# Patient Record
Sex: Male | Born: 1955 | Hispanic: No | Smoking: Current every day smoker
Health system: Southern US, Community
[De-identification: ages and names within clinical notes are randomized; demographics above are authoritative.]

## PROBLEM LIST (undated history)

## (undated) DIAGNOSIS — I2510 Atherosclerotic heart disease of native coronary artery without angina pectoris: Secondary | ICD-10-CM

## (undated) DIAGNOSIS — I10 Essential (primary) hypertension: Secondary | ICD-10-CM

## (undated) HISTORY — PX: OTHER SURGICAL HISTORY: SHX169

## (undated) HISTORY — DX: Atherosclerotic heart disease of native coronary artery without angina pectoris: I25.10

## (undated) HISTORY — PX: CORONARY ARTERY BYPASS GRAFT: SHX141

---

## 2003-07-29 ENCOUNTER — Emergency Department (HOSPITAL_COMMUNITY): Admission: EM | Admit: 2003-07-29 | Discharge: 2003-07-30 | Payer: Self-pay | Admitting: Emergency Medicine

## 2006-04-29 ENCOUNTER — Emergency Department (HOSPITAL_COMMUNITY): Admission: EM | Admit: 2006-04-29 | Discharge: 2006-04-29 | Payer: Self-pay | Admitting: Emergency Medicine

## 2007-01-19 ENCOUNTER — Emergency Department (HOSPITAL_COMMUNITY): Admission: EM | Admit: 2007-01-19 | Discharge: 2007-01-19 | Payer: Self-pay | Admitting: Emergency Medicine

## 2007-11-22 IMAGING — CR DG ELBOW COMPLETE 3+V*R*
2 series · 2 of 2 positions shown · non-contrast
Comparison: none

CLINICAL DATA: Elbow injury, pain

RIGHT ELBOW - 4 VIEW

[view not recorded (1 of 2)]
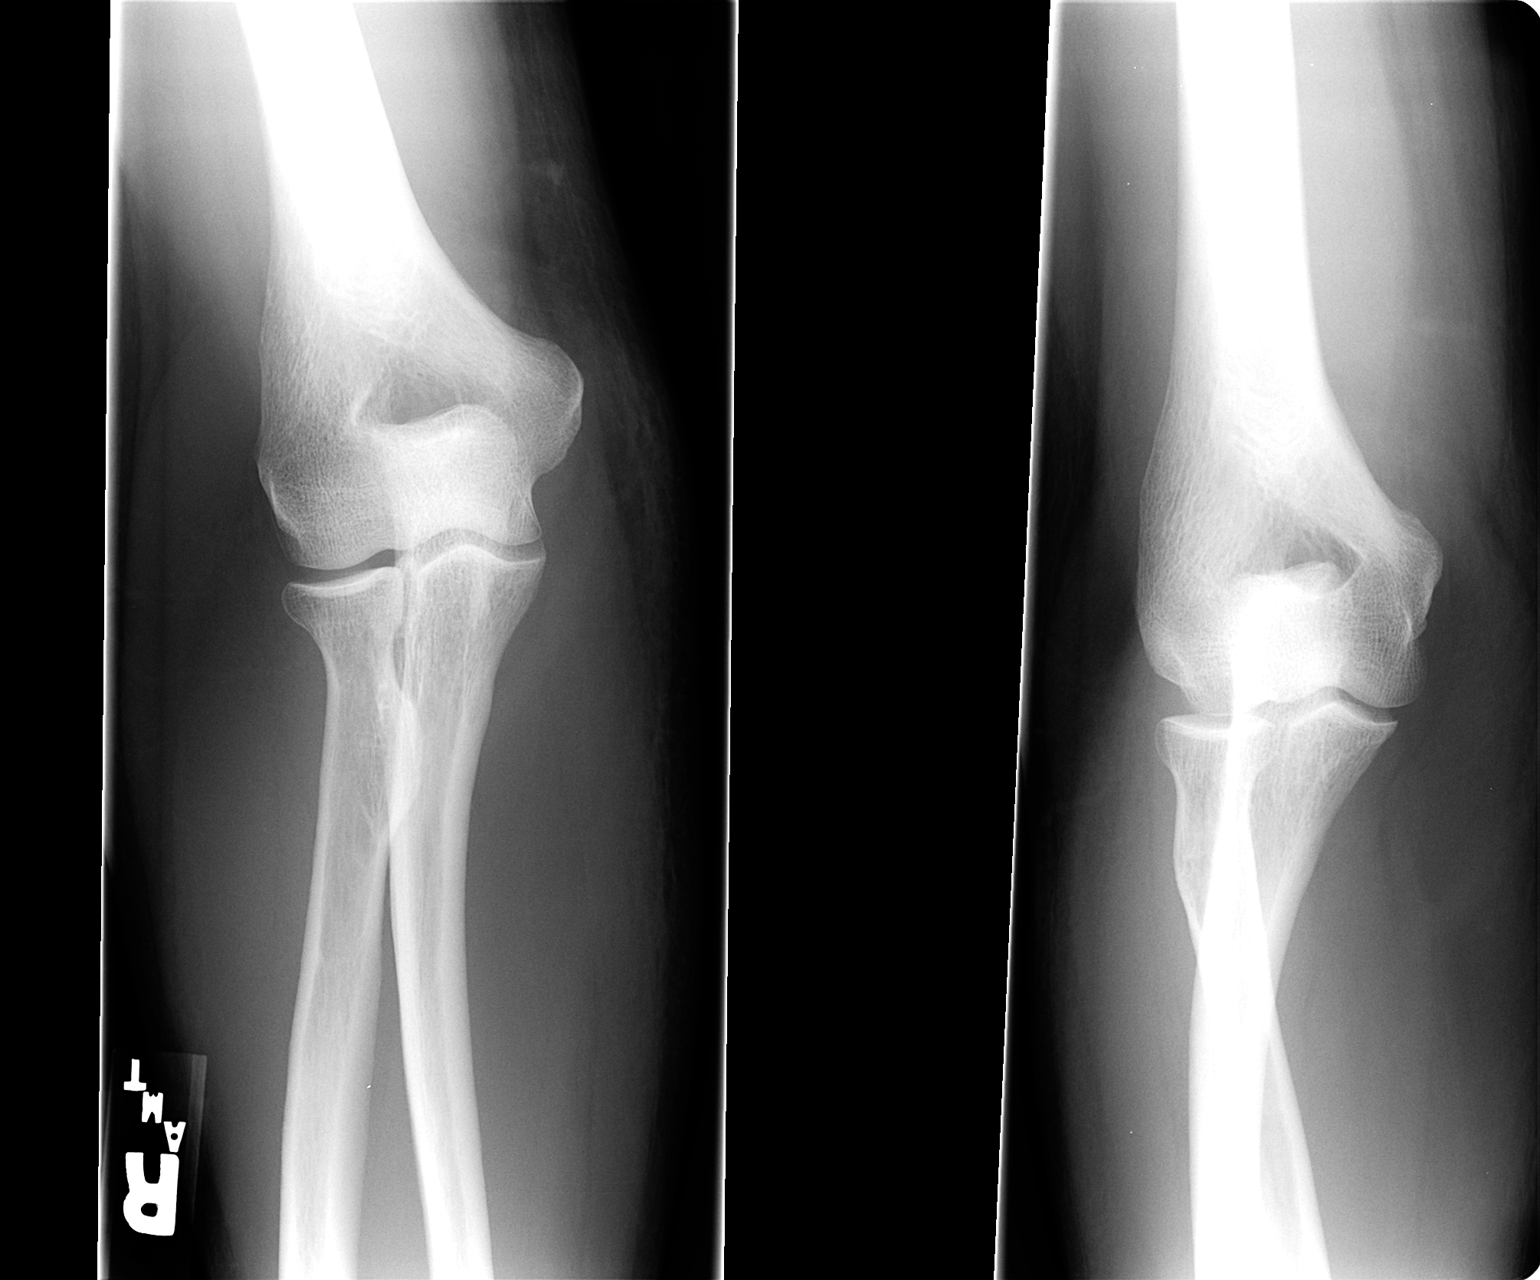

[view not recorded (2 of 2)]
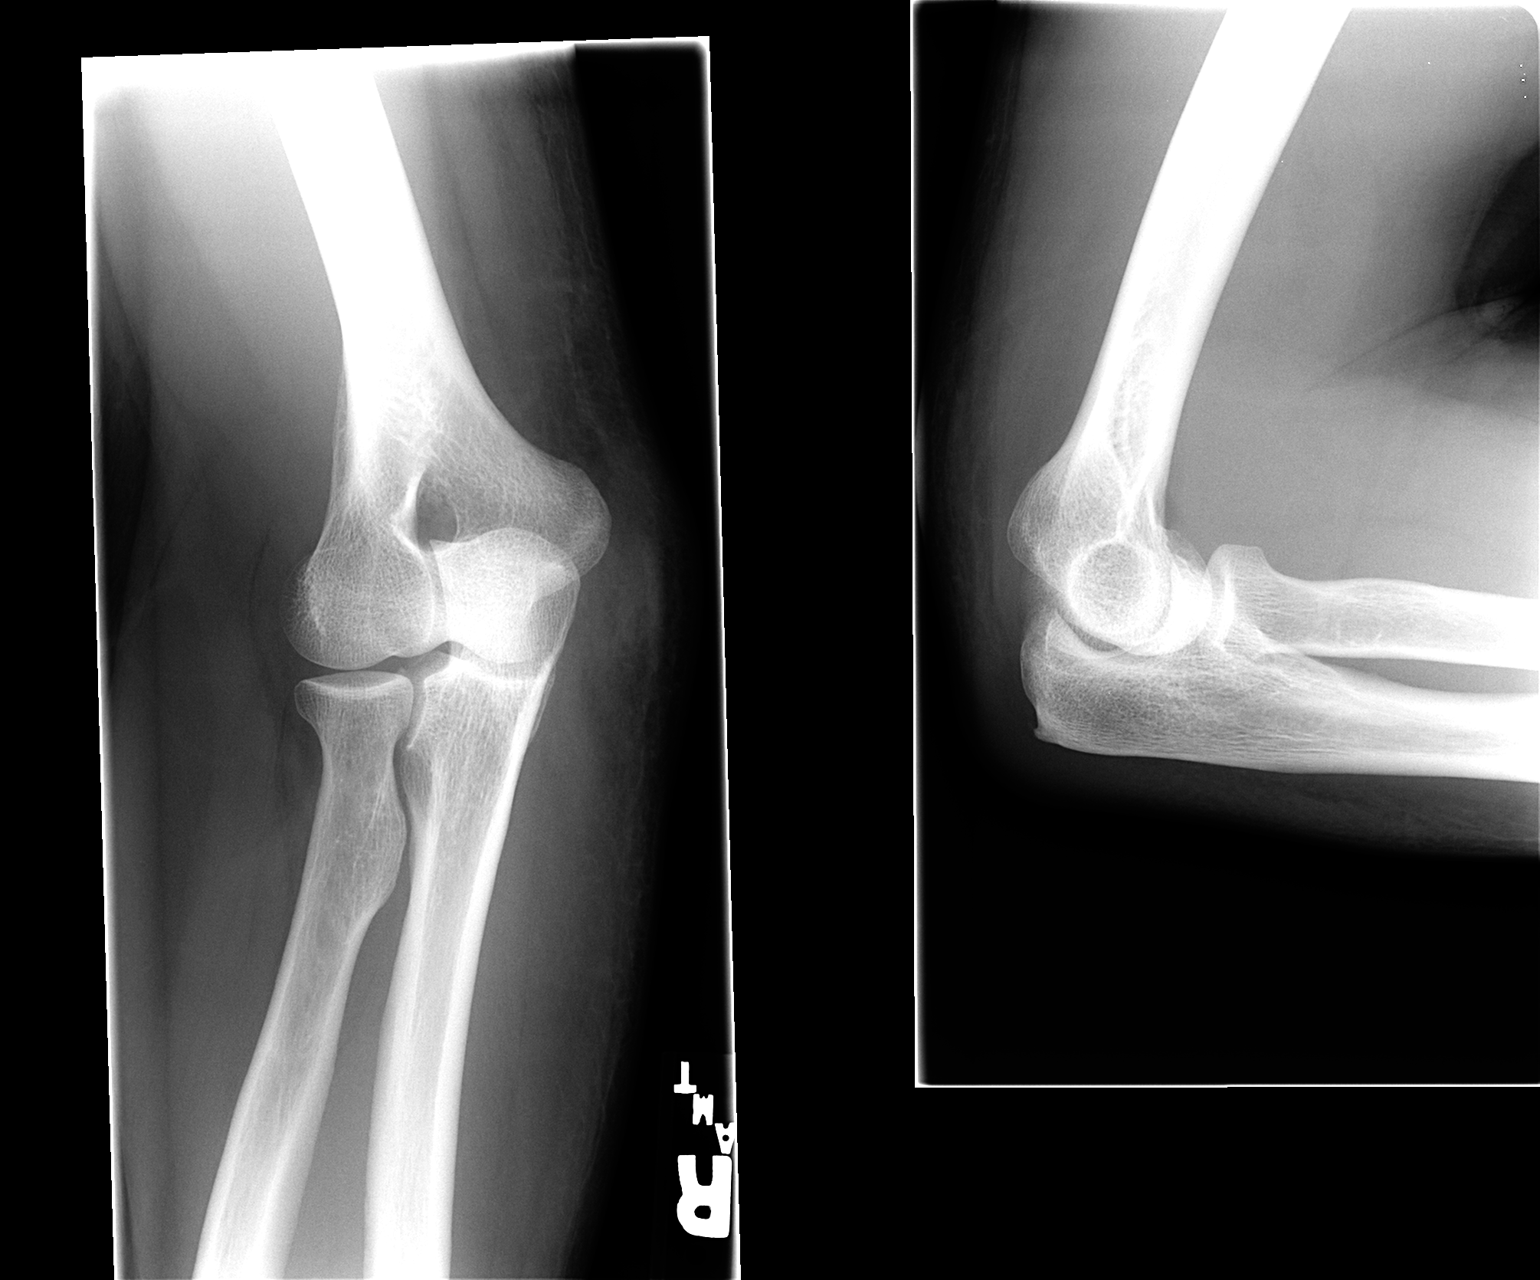

[2 of 2 positions shown; findings below may reference images not displayed]

FINDINGS: There is soft tissue swelling noted posterior and medially within the
right elbow. No joint effusion, fracture, subluxation, or dislocation.

IMPRESSION

No acute bony abnormality.

## 2014-07-06 DIAGNOSIS — I213 ST elevation (STEMI) myocardial infarction of unspecified site: Secondary | ICD-10-CM | POA: Insufficient documentation

## 2019-12-13 DIAGNOSIS — J410 Simple chronic bronchitis: Secondary | ICD-10-CM | POA: Insufficient documentation

## 2019-12-13 DIAGNOSIS — G8929 Other chronic pain: Secondary | ICD-10-CM | POA: Insufficient documentation

## 2019-12-13 DIAGNOSIS — M545 Low back pain, unspecified: Secondary | ICD-10-CM | POA: Insufficient documentation

## 2021-02-18 DIAGNOSIS — R079 Chest pain, unspecified: Secondary | ICD-10-CM | POA: Insufficient documentation

## 2021-06-12 ENCOUNTER — Other Ambulatory Visit: Payer: Self-pay | Admitting: Family Medicine

## 2021-06-12 DIAGNOSIS — I10 Essential (primary) hypertension: Secondary | ICD-10-CM

## 2021-06-20 ENCOUNTER — Ambulatory Visit
Admission: RE | Admit: 2021-06-20 | Discharge: 2021-06-20 | Disposition: A | Discharge status: Home / Self Care | Payer: Disability Insurance | Source: Ambulatory Visit | Attending: Family Medicine | Admitting: Family Medicine

## 2021-06-20 DIAGNOSIS — I340 Nonrheumatic mitral (valve) insufficiency: Secondary | ICD-10-CM | POA: Diagnosis not present

## 2021-06-20 DIAGNOSIS — I10 Essential (primary) hypertension: Secondary | ICD-10-CM

## 2021-06-20 LAB — ECHOCARDIOGRAM COMPLETE
AR max vel: 2.55 cm2
AV Area VTI: 2.76 cm2
AV Area mean vel: 2.25 cm2
AV Mean grad: 1 mmHg
AV Peak grad: 2.2 mmHg
Ao pk vel: 0.75 m/s
Area-P 1/2: 3.83 cm2
Calc EF: 19.8 %
MV VTI: 1.79 cm2
S' Lateral: 5.7 cm
Single Plane A2C EF: 19.7 %
Single Plane A4C EF: 19.7 %

## 2021-06-20 NOTE — Progress Notes (Signed)
*  PRELIMINARY RESULTS* Echocardiogram 2D Echocardiogram has been performed.  Aaron Hines 06/20/2021, 10:58 AM

## 2021-11-04 NOTE — Progress Notes (Deleted)
?Cardiology Office Note:   ? ?Date:  11/04/2021  ? ?ID:  Aaron Hines, DOB Nov 05, 1955, MRN [id number] ? ?PCP:  Aaron Spar, MD ?  ?[facility] Providers ?Cardiologist:  None { ?Click to update primary MD,subspecialty MD or APP then REFRESH:1}   ? ?Referring MD: Aaron Hines*  ? ?CC: *** ?Consulted for the evaluation of *** at the behest of Aaron Spar, MD ? ? ?History of Present Illness:   ? ?Aaron Hines is a 66 y.o. male with a hx of  CAD s/p CABG and HFrEF (EF 20% in 2022). ? ?Patient notes that (s)he is feeling ***.  Has had no chest pain, chest pressure, chest tightness, chest stinging ***.  Discomfort occurs with ***, worsens with ***, and improves with ***.  Patient exertion notable for *** with *** and feels no symptoms.  No shortness of breath, DOE ***.  No PND or orthopnea***.  No weight gain***, leg swelling ***, or abdominal swelling***.  No syncope or near syncope ***. Notes *** no palpitations or funny heart beats.    ? ?Patient reports prior cardiac testing including *** ? ?No history of ***pre-eclampsia, gestation HTN or gestational DM.  No Fen-Phen or drug use***. ? ?Ambulatory BP ***. ? ? ?No past medical history on file. ? ?*** The histories are not reviewed yet. Please review them in the "History" navigator section and refresh this SmartLink. ? ?Current Medications: ?No outpatient medications have been marked as taking for the 11/06/21 encounter (Appointment) with Christell Constant, MD.  ?  ? ?Allergies:   Patient has no allergy information on record.  ? ?Social History  ? ?Socioeconomic History  ? Marital status: Legally Separated  ?  Spouse name: Not on file  ? Number of children: Not on file  ? Years of education: Not on file  ? Highest education level: Not on file  ?Occupational History  ? Not on file  ?Tobacco Use  ? Smoking status: Not on file  ? Smokeless tobacco: Not on file  ?Substance and Sexual Activity  ? Alcohol use: Not on file  ? Drug use:  Not on file  ? Sexual activity: Not on file  ?Other Topics Concern  ? Not on file  ?Social History Narrative  ? Not on file  ? ?Social Determinants of Health  ? ?Financial Resource Strain: Not on file  ?Food Insecurity: Not on file  ?Transportation Needs: Not on file  ?Physical Activity: Not on file  ?Stress: Not on file  ?Social Connections: Not on file  ?  ? ?Family History: ?The patient's ***family history is not on file. ? ?ROS:   ?Please see the history of present illness.    ?*** All other systems reviewed and are negative. ? ?EKGs/Labs/Other Studies Reviewed:   ? ?The following studies were reviewed today: ?*** ? ?EKG:  EKG is *** ordered today.  The ekg ordered today demonstrates *** ? ?Recent Labs: ?No results found for requested labs within last 8760 hours.  ?Recent Lipid Panel ?No results found for: CHOL, TRIG, HDL, CHOLHDL, VLDL, LDLCALC, LDLDIRECT ? ? ?Risk Assessment/Calculations:   ?{Does this patient have ATRIAL FIBRILLATION?:[phone]} ? ?    ? ?Physical Exam:   ? ?VS:  There were no vitals taken for this visit.   ? ?Wt Readings from Last 3 Encounters:  ?No data found for Wt  ?  ? ?GEN: *** Well nourished, well developed in no acute distress ?HEENT: Normal ?NECK: No JVD; No carotid  bruits ?LYMPHATICS: No lymphadenopathy ?CARDIAC: ***RRR, no murmurs, rubs, gallops ?RESPIRATORY:  Clear to auscultation without rales, wheezing or rhonchi  ?ABDOMEN: Soft, non-tender, non-distended ?MUSCULOSKELETAL:  No edema; No deformity  ?SKIN: Warm and dry ?NEUROLOGIC:  Alert and oriented x 3 ?PSYCHIATRIC:  Normal affect  ? ?ASSESSMENT:   ? ?No diagnosis found. ?PLAN:   ? ? Heart Failure *** Ejection Fraction  ?- NYHA class ***, Stage ***, ***volemic, etiology from *** ?- Diuretic regimen: Lasix/torsemide/Bumex +/- metolazone *** ?- Discussed the importance of fluid restriction of < 2 L, salt restriction, and checking daily weights  ?- *** Replace electrolytes PRN and keep K>4 and Mg>2. ?- *** BMP/BNP/Mg  ?-  Coreg/bisoprolol/metoprolol***  ?- ARNI/ARB/ACEi*** ?- aldactone*** ?- isordil/hydralazine for afterload reduction***  ?- TTE ordered *** ?- SGLT2i*** ?- on optimally titrated GDMT or with non hepatic/geriatic barriers to GDMT (Renal/hypotension issues), will consider Vericiguat 2.5-> 5-> 10 ?- Device Indications: *** ?- Clinical Trials: *** ?- Indication for AHF: *** ?-Transferring Sat %/Ferritin *** ?- HIV- *** ?- RF and ANA *** ?- SPEP *** ? ? ?   ? ?{Are you ordering a CV Procedure (e.g. stress test, cath, DCCV, TEE, etc)?   Press F2        :[id number]}  ? ? ?Medication Adjustments/Labs and Tests Ordered: ?Current medicines are reviewed at length with the patient today.  Concerns regarding medicines are outlined above.  ?No orders of the defined types were placed in this encounter. ? ?No orders of the defined types were placed in this encounter. ? ? ?There are no Patient Instructions on file for this visit.  ? ?Signed, ?Christell Constant, MD  ?11/04/2021 7:58 PM    ?[facility] ? ?

## 2021-11-06 ENCOUNTER — Ambulatory Visit: Payer: Disability Insurance | Admitting: Internal Medicine

## 2021-11-18 ENCOUNTER — Ambulatory Visit: Payer: Disability Insurance | Admitting: Internal Medicine

## 2021-11-30 ENCOUNTER — Emergency Department (HOSPITAL_COMMUNITY)
Admission: EM | Admit: 2021-11-30 | Discharge: 2021-11-30 | Disposition: A | Discharge status: Home / Self Care | Payer: 59 | Attending: Emergency Medicine | Admitting: Emergency Medicine

## 2021-11-30 ENCOUNTER — Emergency Department (HOSPITAL_COMMUNITY): Payer: 59

## 2021-11-30 ENCOUNTER — Encounter (HOSPITAL_COMMUNITY): Payer: Self-pay

## 2021-11-30 ENCOUNTER — Other Ambulatory Visit: Payer: Self-pay

## 2021-11-30 DIAGNOSIS — M67912 Unspecified disorder of synovium and tendon, left shoulder: Secondary | ICD-10-CM

## 2021-11-30 DIAGNOSIS — M25512 Pain in left shoulder: Secondary | ICD-10-CM | POA: Diagnosis not present

## 2021-11-30 DIAGNOSIS — M75102 Unspecified rotator cuff tear or rupture of left shoulder, not specified as traumatic: Secondary | ICD-10-CM | POA: Diagnosis not present

## 2021-11-30 HISTORY — DX: Essential (primary) hypertension: I10

## 2021-11-30 MED ORDER — HYDROCODONE-ACETAMINOPHEN 5-325 MG PO TABS
1.0000 | ORAL_TABLET | Freq: Once | ORAL | Status: AC
  Administered 2021-11-30: 1 via ORAL
  Filled 2021-11-30: qty 1

## 2021-11-30 MED ORDER — PREDNISONE 50 MG PO TABS
60.0000 mg | ORAL_TABLET | Freq: Once | ORAL | Status: AC
  Administered 2021-11-30: 60 mg via ORAL
  Filled 2021-11-30: qty 1

## 2021-11-30 MED ORDER — HYDROCODONE-ACETAMINOPHEN 5-325 MG PO TABS: 1.0000 | ORAL_TABLET | ORAL | 0 refills | Status: AC | PRN

## 2021-11-30 NOTE — ED Triage Notes (Signed)
Pt c/o left shoulder pain for the past 2 days. Pt denies injury. States pain worsens when he tries to lift his arm.  ?

## 2021-11-30 NOTE — Discharge Instructions (Addendum)
Use heat on the sore area 3-4 times a day.  Use a sling as needed for comfort.  Use ibuprofen 4 mg 3 times a day with meals for pain.  Do not drive or drink alcohol when taking the narcotic pain reliever. ?

## 2021-11-30 NOTE — ED Provider Notes (Signed)
?[facility] EMERGENCY DEPARTMENT ?Provider Note ? ? ?CSN: [id number] ?Arrival date & time: 11/30/21  0803 ? ?  ? ?History ? ?Chief Complaint  ?Patient presents with  ? Shoulder Pain  ? ? ?Aaron Hines is a 66 y.o. male. ? ?HPI ?Patient presenting for evaluation of the shoulder pain, without known trauma.  He thinks it "might be my rotator cuff."  He denies chronic shoulder pain. ?  ? ?Home Medications ?Prior to Admission medications   ?Not on File  ?   ? ?Allergies    ?Patient has no known allergies.   ? ?Review of Systems   ?Review of Systems ? ?Physical Exam ?Updated Vital Signs ?BP (!) 139/92   Pulse 91   Temp 97.8 ?F (36.6 ?C)   Resp 19   Ht 6' (1.829 m)   Wt 90.7 kg   SpO2 99%   BMI 27.12 kg/m?  ?Physical Exam ?Vitals and nursing note reviewed.  ?Constitutional:   ?   Appearance: He is well-developed. He is not ill-appearing.  ?HENT:  ?   Head: Normocephalic and atraumatic.  ?   Right Ear: External ear normal.  ?   Left Ear: External ear normal.  ?Eyes:  ?   Conjunctiva/sclera: Conjunctivae normal.  ?   Pupils: Pupils are equal, round, and reactive to light.  ?Neck:  ?   Trachea: Phonation normal.  ?Cardiovascular:  ?   Rate and Rhythm: Normal rate.  ?Pulmonary:  ?   Effort: Pulmonary effort is normal.  ?Abdominal:  ?   General: There is no distension.  ?Musculoskeletal:  ?   Cervical back: Normal range of motion and neck supple.  ?   Comments: Left shoulder diffusely tender without deformity.  He guards against any movement of the shoulder both actively and passively.  Neurovascular intact distally in the left hand  ?Skin: ?   General: Skin is warm and dry.  ?Neurological:  ?   Mental Status: He is alert and oriented to person, place, and time.  ?   Cranial Nerves: No cranial nerve deficit.  ?   Sensory: No sensory deficit.  ?   Motor: No abnormal muscle tone.  ?   Coordination: Coordination normal.  ?Psychiatric:     ?   Mood and Affect: Mood normal.     ?   Behavior: Behavior normal.     ?   Thought  Content: Thought content normal.     ?   Judgment: Judgment normal.  ? ? ?ED Results / Procedures / Treatments   ?Labs ?(all labs ordered are listed, but only abnormal results are displayed) ?Labs Reviewed - No data to display ? ?EKG ?None ? ?Radiology ?No results found. ? ?Procedures ?Procedures  ? ? ?Medications Ordered in ED ?Medications - No data to display ? ?ED Course/ Medical Decision Making/ A&P ?  ?                        ?Medical Decision Making ?Left shoulder pain without trauma.  No similar in the past. ? ?Problems Addressed: ?Acute pain of left shoulder: acute illness or injury ?   Details: Onset without trauma ?Tendinopathy of left rotator cuff: undiagnosed new problem with uncertain prognosis ?   Details: Likely cause of left shoulder pain ? ?Amount and/or Complexity of Data Reviewed ?Independent Historian:  ?   Details: He is a cogent historian ?External Data Reviewed: notes. ?   Details: PDMP databank does not indicate  any use of narcotics. ?Radiology: ordered and independent interpretation performed. ?   Details: Radiography left shoulder-no fracture or dislocation.  Tendinopathy present consistent with rotator cuff abnormality ? ?Risk ?OTC drugs. ?Prescription drug management. ?Decision regarding hospitalization. ?Risk Details: Patient with atraumatic left shoulder pain, causing difficulty sleeping.  Evaluation consistent with tendinopathy from rotator cuff.  No evidence for fracture.  Doubt muscle strain.  Patient improved with treatment and stable for discharge.  Recommending heat and sling as needed.  Ibuprofen 4 mg 3 times daily and as needed narcotic analgesia.  Instructed not to drive or drink alcohol when taking narcotic.  Referral to orthopedics for ongoing management.  No requirement for hospitalization at this time. ? ? ? ? ? ? ? ? ? ? ?Final Clinical Impression(s) / ED Diagnoses ?Final diagnoses:  ?None  ? ? ?Rx / DC Orders ?ED Discharge Orders   ? ? None  ? ?  ? ? ?  ?Mancel Bale,  MD ?11/30/21 [phone] ? ?

## 2021-12-09 NOTE — Progress Notes (Signed)
?Cardiology Office Note:   ? ?Date:  12/10/2021  ? ?ID:  Aaron Hines, DOB 1956/04/10, MRN [id number] ? ?PCP:  Benetta Spar, MD ?  ?[facility] Providers ?Cardiologist:  Christell Constant, MD    ? ?Referring MD: Benetta Spar*  ? ?CC: Follow up HF ?Consulted for the evaluation of HF at the behest of Benetta Spar, MD ? ?History of Present Illness:   ? ?Aaron Hines is a 66 y.o. male with a hx of CAD s/p CABG (patent LIMA to LAD, SVG to OM, and SVG to RCA per OSH recoreds 2022), Aorthc atherosclerosis, HTN, HFrEF Ischemic cardiomyopathy, and tobacco abuse. ?Formerly seen [facility] in [place] and [facility]. ?LVEF 20%, formerly 36%. ? ?Patient notes that he is feeling fine.  His prior anginal equivalent was both arm pain- has had non.  Has had no chest pain, chest pressure, chest tightness, chest stinging.  Patient exertion notable for walking behind a push lawnmower and do yard work with no issues.and feels no symptoms.  No shortness of breath.  Does notes that gets DOE after 30 minutes of work. No PND or orthopnea.  No weight gain, leg swelling , or abdominal swelling.  No syncope or near syncope . Notes  rare palpitations or funny heart beats. Did not feel the funny heart beat with the EKG.      ? ?Does have Leg pain. ? ? ?Past Medical History:  ?Diagnosis Date  ? Hypertension   ? ? ?History reviewed. No pertinent surgical history. ? ?Current Medications: ?Current Meds  ?Medication Sig  ? albuterol (VENTOLIN HFA) 108 (90 Base) MCG/ACT inhaler as needed.  ? atorvastatin (LIPITOR) 20 MG tablet Take 20 mg by mouth at bedtime.  ? dapagliflozin propanediol (FARXIGA) 10 MG TABS tablet Take 1 tablet (10 mg total) by mouth daily before breakfast.  ? HYDROcodone-acetaminophen (NORCO) 5-325 MG tablet Take 1 tablet by mouth every 4 (four) hours as needed.  ? hydrOXYzine (ATARAX) 10 MG tablet Take 10 mg by mouth as needed for anxiety.  ? isosorbide  mononitrate (IMDUR) 30 MG 24 hr tablet Take 30 mg by mouth daily.  ? lisinopril (ZESTRIL) 20 MG tablet Take 40 mg by mouth daily.  ? metoprolol tartrate (LOPRESSOR) 25 MG tablet Take 25 mg by mouth daily.  ? spironolactone (ALDACTONE) 50 MG tablet Take 50 mg by mouth daily.  ?  ? ?Allergies:   Patient has no known allergies.  ? ?Social History  ? ?Socioeconomic History  ? Marital status: Legally Separated  ?  Spouse name: Not on file  ? Number of children: Not on file  ? Years of education: Not on file  ? Highest education level: Not on file  ?Occupational History  ? Not on file  ?Tobacco Use  ? Smoking status: Every Day  ?  Packs/day: 1.00  ?  Types: Cigarettes  ? Smokeless tobacco: Never  ?Vaping Use  ? Vaping Use: Never used  ?Substance and Sexual Activity  ? Alcohol use: Not Currently  ? Drug use: Yes  ?  Types: Cocaine  ?  Comment: last used over 1 week ago  ? Sexual activity: Not on file  ?Other Topics Concern  ? Not on file  ?Social History Narrative  ? Not on file  ? ?Social Determinants of Health  ? ?Financial Resource Strain: Not on file  ?Food Insecurity: Not on file  ?Transportation Needs: Not on file  ?Physical Activity: Not on  file  ?Stress: Not on file  ?Social Connections: Not on file  ?  ?Family History: ?History of coronary artery disease notable for no members. ?History of heart failure notable for no members. ?History of arrhythmia notable for no members. ? ?ROS:   ?Please see the history of present illness.    ? All other systems reviewed and are negative. ? ?EKGs/Labs/Other Studies Reviewed:   ? ?The following studies were reviewed today: ? ?EKG:  EKG is  ordered today.  The ekg ordered today demonstrates  ?12/10/21: SR 85 RBBB with rare PVCs ? ?Recent Labs: ?No results found for requested labs within last 8760 hours.  ?Recent Lipid Panel ?No results found for: CHOL, TRIG, HDL, CHOLHDL, VLDL, LDLCALC, LDLDIRECT ? ?    ? ?Physical Exam:   ? ?VS:  BP (!) 88/60   Pulse 85   Ht 6' (1.829 m)   Wt  186 lb (84.4 kg)   SpO2 97%   BMI 25.23 kg/m?    ? ?Wt Readings from Last 3 Encounters:  ?12/10/21 186 lb (84.4 kg)  ?11/30/21 200 lb (90.7 kg)  ?  ? ?GEN:  Well nourished, well developed in no acute distress ?HEENT: Poor dentition ?NECK: No JVD; No carotid bruits ?LYMPHATICS: No lymphadenopathy ?CARDIAC: RRR, no murmurs, rubs, gallops ?RESPIRATORY:  Clear to auscultation without rales, wheezing or rhonchi  ?ABDOMEN: Soft, non-tender, non-distended ?MUSCULOSKELETAL:  No edema; No deformity  ?SKIN: Warm and dry ?NEUROLOGIC:  Alert and oriented x 3 ?PSYCHIATRIC:  Normal affect  ? ?ASSESSMENT:   ? ?1. HFrEF (heart failure with reduced ejection fraction) (HCC)   ?2. Essential hypertension   ?3. Tobacco abuse   ?4. Coronary artery disease of native artery of native heart with stable angina pectoris (HCC)   ? ?PLAN:   ? ?Coronary Artery Disease; Obstructive/Nonobstructive ?- anatomy: LIMA to LAD, SVG to OM, and SVG to RCA, asymptomatic since last Cath 2022 ?- continue ASA 81 mg; ?- continue statin, goal LDL < 55 ?- continue BB ?- continue nitrates (Imdur 30 mg PO Daily) ?- continue ACEi ? ?Heart Failure Reduced Ejection Fraction  ?HTN ?Tobacco abuse ?- NYHA class I, Stage IV, euvolemic, etiology from ischemic suspected ?- Diuretic regimen: Non ?- we will see if we can get industry support to start Faxiga 10 mg or if any SGLT2i is covered ?- continue succinate 25 mg PO daily, unable to titrate further ?- continue lisinopril 20mg ; BP would not tolerate ARNI ?- aldactone 50 mg ?- TTE ordered in [place] for f/u ?- we have discussed SCD eval (he wore a Livevest fror 1.5 days after his 2022 admission) ?- we have discussed smoking cessation; I worry long term about his risk for needing AHF; he is presently not a candidate ? ? ?   ? ?Fall-Winter follow up me or APP in [place]; I would eventually like to get him to AHF clini ? ?Medication Adjustments/Labs and Tests Ordered: ?Current medicines are reviewed at length with  the patient today.  Concerns regarding medicines are outlined above.  ?Orders Placed This Encounter  ?Procedures  ? ECHOCARDIOGRAM COMPLETE  ? ?Meds ordered this encounter  ?Medications  ? dapagliflozin propanediol (FARXIGA) 10 MG TABS tablet  ?  Sig: Take 1 tablet (10 mg total) by mouth daily before breakfast.  ?  Dispense:  90 tablet  ?  Refill:  3  ? ? ?Patient Instructions  ?Medication Instructions:  ?Your physician has recommended you make the following change in your medication:  ?START:  dapagliflozin (Farxiga) 10 mg by mouth once daily before breakfast ?*If you need a refill on your cardiac medications before your next appointment, please call your pharmacy* ? ? ?Lab Work: ?NONE ?If you have labs (blood work) drawn today and your tests are completely normal, you will receive your results only by: ?MyChart Message (if you have MyChart) OR ?A paper copy in the mail ?If you have any lab test that is abnormal or we need to change your treatment, we will call you to review the results. ? ? ?Testing/Procedures: ?Your physician has requested that you have an echocardiogram IN [place]. Echocardiography is a painless test that uses sound waves to create images of your heart. It provides your doctor with information about the size and shape of your heart and how well your heart?s chambers and valves are working. This procedure takes approximately one hour. There are no restrictions for this procedure. ? ? ? ?Follow-Up: ?At [facility], you and your health needs are our priority.  As part of our continuing mission to provide you with exceptional heart care, we have created designated Provider Care Teams.  These Care Teams include your primary Cardiologist (physician) and Advanced Practice Providers (APPs -  Physician Assistants and Nurse Practitioners) who all work together to provide you with the care you need, when you need it. ? ?We recommend signing up for the patient portal called "MyChart".  Sign up  information is provided on this After Visit Summary.  MyChart is used to connect with patients for Virtual Visits (Telemedicine).  Patients are able to view lab/test results, encounter notes, upcoming appointments, etc.  N

## 2021-12-10 ENCOUNTER — Encounter: Payer: Self-pay | Admitting: Internal Medicine

## 2021-12-10 ENCOUNTER — Ambulatory Visit (INDEPENDENT_AMBULATORY_CARE_PROVIDER_SITE_OTHER): Payer: 59 | Admitting: Internal Medicine

## 2021-12-10 VITALS — BP 88/60 | HR 85 | Ht 72.0 in | Wt 186.0 lb

## 2021-12-10 DIAGNOSIS — I10 Essential (primary) hypertension: Secondary | ICD-10-CM | POA: Diagnosis not present

## 2021-12-10 DIAGNOSIS — I25118 Atherosclerotic heart disease of native coronary artery with other forms of angina pectoris: Secondary | ICD-10-CM

## 2021-12-10 DIAGNOSIS — Z720 Tobacco use: Secondary | ICD-10-CM | POA: Insufficient documentation

## 2021-12-10 DIAGNOSIS — I5020 Unspecified systolic (congestive) heart failure: Secondary | ICD-10-CM | POA: Diagnosis not present

## 2021-12-10 MED ORDER — DAPAGLIFLOZIN PROPANEDIOL 10 MG PO TABS: 10.0000 mg | ORAL_TABLET | Freq: Every day | ORAL | 3 refills | Status: DC

## 2021-12-10 NOTE — Patient Instructions (Signed)
Medication Instructions:  ?Your physician has recommended you make the following change in your medication:  ?START:  dapagliflozin (Farxiga) 10 mg by mouth once daily before breakfast ?*If you need a refill on your cardiac medications before your next appointment, please call your pharmacy* ? ? ?Lab Work: ?NONE ?If you have labs (blood work) drawn today and your tests are completely normal, you will receive your results only by: ?MyChart Message (if you have MyChart) OR ?A paper copy in the mail ?If you have any lab test that is abnormal or we need to change your treatment, we will call you to review the results. ? ? ?Testing/Procedures: ?Your physician has requested that you have an echocardiogram IN [place]. Echocardiography is a painless test that uses sound waves to create images of your heart. It provides your doctor with information about the size and shape of your heart and how well your heart?s chambers and valves are working. This procedure takes approximately one hour. There are no restrictions for this procedure. ? ? ? ?Follow-Up: ?At [facility], you and your health needs are our priority.  As part of our continuing mission to provide you with exceptional heart care, we have created designated Provider Care Teams.  These Care Teams include your primary Cardiologist (physician) and Advanced Practice Providers (APPs -  Physician Assistants and Nurse Practitioners) who all work together to provide you with the care you need, when you need it. ? ?We recommend signing up for the patient portal called "MyChart".  Sign up information is provided on this After Visit Summary.  MyChart is used to connect with patients for Virtual Visits (Telemedicine).  Patients are able to view lab/test results, encounter notes, upcoming appointments, etc.  Non-urgent messages can be sent to your provider as well.   ?To learn more about what you can do with MyChart, go to [email or web address].   ? ?Your next  appointment:   ?6 month(s) ? ?The format for your next appointment:   ?In Person ? ?Provider:   ?You may see Riley Lam, MD or one of the following Advanced Practice Providers on your designated Care Team:   ?Randall An, PA-C  ?Jacolyn Reedy, PA-C   ? ?Important Information About Sugar ? ? ? ? ?  ?

## 2021-12-11 NOTE — Addendum Note (Signed)
Addended by: Winifred Olive on: 12/11/2021 04:09 PM ? ? Modules accepted: Orders ? ?

## 2021-12-18 ENCOUNTER — Telehealth: Payer: Self-pay | Admitting: Internal Medicine

## 2021-12-18 NOTE — Telephone Encounter (Signed)
I will forward to Dr.Chandrasekhar for review ?

## 2021-12-18 NOTE — Telephone Encounter (Signed)
Pt c/o medication issue: ? ?1. Name of Medication:  ?dapagliflozin propanediol (FARXIGA) 10 MG TABS tablet ? ?2. How are you currently taking this medication (dosage and times per day)?  ?1 tablet once daily by mouth, in the morning  ? ?3. Are you having a reaction (difficulty breathing--STAT)?  ? ?4. What is your medication issue?  ? ? ?Patient states this medication made him feel like his throat was closing since he started it. He states he mainly had that feeling while eating. He was also low on energy. He states he stopped taking it 2 days ago and he is starting to feel normal again. ?

## 2021-12-18 NOTE — Telephone Encounter (Signed)
Aaron Hines removed from medication list,aded as allergy. ? ? ?I left message for patient to return call. ?

## 2022-02-07 ENCOUNTER — Emergency Department (HOSPITAL_COMMUNITY)
Admission: EM | Admit: 2022-02-07 | Discharge: 2022-02-07 | Disposition: A | Discharge status: Home / Self Care | Payer: 59 | Attending: Emergency Medicine | Admitting: Emergency Medicine

## 2022-02-07 ENCOUNTER — Other Ambulatory Visit: Payer: Self-pay

## 2022-02-07 ENCOUNTER — Encounter (HOSPITAL_COMMUNITY): Payer: Self-pay | Admitting: Emergency Medicine

## 2022-02-07 DIAGNOSIS — W57XXXA Bitten or stung by nonvenomous insect and other nonvenomous arthropods, initial encounter: Secondary | ICD-10-CM | POA: Diagnosis not present

## 2022-02-07 DIAGNOSIS — Y92007 Garden or yard of unspecified non-institutional (private) residence as the place of occurrence of the external cause: Secondary | ICD-10-CM | POA: Diagnosis not present

## 2022-02-07 DIAGNOSIS — S70362A Insect bite (nonvenomous), left thigh, initial encounter: Secondary | ICD-10-CM | POA: Insufficient documentation

## 2022-02-07 DIAGNOSIS — Y9389 Activity, other specified: Secondary | ICD-10-CM | POA: Insufficient documentation

## 2022-02-07 MED ORDER — SULFAMETHOXAZOLE-TRIMETHOPRIM 800-160 MG PO TABS
1.0000 | ORAL_TABLET | Freq: Once | ORAL | Status: AC
  Administered 2022-02-07: 1 via ORAL
  Filled 2022-02-07: qty 1

## 2022-02-07 MED ORDER — SULFAMETHOXAZOLE-TRIMETHOPRIM 800-160 MG PO TABS: 1.0000 | ORAL_TABLET | Freq: Two times a day (BID) | ORAL | 0 refills | Status: AC

## 2022-02-07 NOTE — ED Triage Notes (Signed)
Pt to ER with c/o insect bite to left posterior thigh.  Area of redness noted around bite.

## 2022-02-07 NOTE — Discharge Instructions (Signed)
Take the entire course of antibiotics.  As discussed I also recommend warm compresses or warm tub soak for 20 minutes twice daily.  Avoid rubbing and squeezing the site which can cause a worse infection.

## 2022-02-07 NOTE — ED Provider Notes (Signed)
[facility] EMERGENCY DEPARTMENT Provider Note   CSN: [id number] Arrival date & time: 02/07/22  1132     History  Chief Complaint  Patient presents with   Insect Bite    Aaron Hines is a 66 y.o. male presenting for evaluation of a suspected infected insect bite.  He was working in his yard about 1 week ago when he felt something bite him in his left posterior upper thigh, describing a sharp stab of pain.  He describes discomfort at the site and pretty much ignored it expecting it would resolve, however continues to be tender and now has some surrounding redness and increased tenderness around the site.  He reports a central not which she has tried to squeeze, denies obtaining any discharge from the site.  He denies fevers or chills, also denies rash, shortness of breath, cough or wheezing.  He has no idea what bit him.  He has had no treatments for for his symptoms prior to arrival.  The history is provided by the patient.       Home Medications Prior to Admission medications   Medication Sig Start Date End Date Taking? Authorizing Provider  sulfamethoxazole-trimethoprim (BACTRIM DS) 800-160 MG tablet Take 1 tablet by mouth 2 (two) times daily for 10 days. 02/07/22 02/17/22 Yes Chasity Outten, Raynelle Fanning, PA-C  albuterol (VENTOLIN HFA) 108 (90 Base) MCG/ACT inhaler as needed. 10/28/21   [provider]  atorvastatin (LIPITOR) 20 MG tablet Take 20 mg by mouth at bedtime. 09/09/21   [provider]  HYDROcodone-acetaminophen (NORCO) 5-325 MG tablet Take 1 tablet by mouth every 4 (four) hours as needed. 11/30/21   Mancel Bale, MD  hydrOXYzine (ATARAX) 10 MG tablet Take 10 mg by mouth as needed for anxiety. 07/16/21   [provider]  isosorbide mononitrate (IMDUR) 30 MG 24 hr tablet Take 30 mg by mouth daily. 11/18/21   [provider]  lisinopril (ZESTRIL) 20 MG tablet Take 40 mg by mouth daily. 11/18/21   [provider]  metoprolol tartrate (LOPRESSOR) 25 MG  tablet Take 25 mg by mouth daily. 11/18/21   [provider]  spironolactone (ALDACTONE) 50 MG tablet Take 50 mg by mouth daily. 11/18/21   [provider]      Allergies    Farxiga [dapagliflozin]    Review of Systems   Review of Systems  Constitutional:  Negative for chills and fever.  HENT:  Negative for congestion and sore throat.   Eyes: Negative.   Respiratory:  Negative for chest tightness and shortness of breath.   Cardiovascular:  Negative for chest pain.  Gastrointestinal:  Negative for abdominal pain, nausea and vomiting.  Genitourinary: Negative.   Musculoskeletal:  Negative for arthralgias, joint swelling and neck pain.  Skin:  Positive for color change and wound. Negative for rash.  Neurological:  Negative for dizziness, weakness, light-headedness, numbness and headaches.  Psychiatric/Behavioral: Negative.      Physical Exam Updated Vital Signs BP 123/73 (BP Location: Right Arm)   Pulse 77   Temp 98 F (36.7 C) (Oral)   Resp 18   Ht 6' (1.829 m)   Wt 81.6 kg   SpO2 97%   BMI 24.41 kg/m  Physical Exam Constitutional:      General: He is not in acute distress.    Appearance: He is well-developed.  HENT:     Head: Normocephalic.  Cardiovascular:     Rate and Rhythm: Normal rate.  Pulmonary:     Effort: Pulmonary effort  is normal.     Breath sounds: No wheezing.  Musculoskeletal:        General: Normal range of motion.     Cervical back: Neck supple.  Skin:    Findings: Erythema present.     Comments: Approximate 1 cm area of induration left upper posterior thigh with a 1 cm surrounding erythema, there is no central clearing.  There is a scab over the central punctum.  There is no fluctuance, no red streaking.     ED Results / Procedures / Treatments   Labs (all labs ordered are listed, but only abnormal results are displayed) Labs Reviewed - No data to display  EKG None  Radiology No results found.  Procedures Procedures     Medications Ordered in ED Medications  sulfamethoxazole-trimethoprim (BACTRIM DS) 800-160 MG per tablet 1 tablet (1 tablet Oral Given 02/07/22 1248)    ED Course/ Medical Decision Making/ A&P                           Medical Decision Making Patient with a suspected infected insect bite of unclear source.  There is no suggestion of an abscess pocket that would require I&D.  History of presentation is not consistent with a tick bite.  He had sharp stabbing pain at the inciting event, suggesting a stinging insect, possibly a spider.  There is no central necrosis, exam and history is not consistent with Matheney recluse.  Exam does suggest either significant inflammation or an early cellulitis/infected insect bite.  Has been started on Bactrim, also discussed warm compresses, close follow-up with his PCP if symptoms or not improving over the next week, sooner for any worsening symptoms.  Risk Prescription drug management.           Final Clinical Impression(s) / ED Diagnoses Final diagnoses:  Insect bite of left thigh, initial encounter    Rx / DC Orders ED Discharge Orders          Ordered    sulfamethoxazole-trimethoprim (BACTRIM DS) 800-160 MG tablet  2 times daily        02/07/22 1338              Burgess Amor, Cordelia Poche 02/07/22 1629    Cheryll Cockayne, MD 02/21/22 [phone]

## 2022-04-09 ENCOUNTER — Other Ambulatory Visit: Payer: Self-pay | Admitting: Gerontology

## 2022-04-09 ENCOUNTER — Other Ambulatory Visit (HOSPITAL_COMMUNITY): Payer: Self-pay | Admitting: Gerontology

## 2022-04-09 DIAGNOSIS — I739 Peripheral vascular disease, unspecified: Secondary | ICD-10-CM

## 2022-04-18 ENCOUNTER — Ambulatory Visit (HOSPITAL_COMMUNITY)
Admission: RE | Admit: 2022-04-18 | Discharge: 2022-04-18 | Disposition: A | Discharge status: Home / Self Care | Payer: 59 | Source: Ambulatory Visit | Attending: Gerontology | Admitting: Gerontology

## 2022-04-18 DIAGNOSIS — I739 Peripheral vascular disease, unspecified: Secondary | ICD-10-CM | POA: Diagnosis present

## 2022-04-24 ENCOUNTER — Encounter (HOSPITAL_COMMUNITY): Payer: Self-pay

## 2022-04-24 ENCOUNTER — Emergency Department (HOSPITAL_COMMUNITY)
Admission: EM | Admit: 2022-04-24 | Discharge: 2022-04-24 | Disposition: A | Discharge status: Home / Self Care | Payer: 59 | Attending: Emergency Medicine | Admitting: Emergency Medicine

## 2022-04-24 ENCOUNTER — Other Ambulatory Visit: Payer: Self-pay

## 2022-04-24 DIAGNOSIS — W57XXXA Bitten or stung by nonvenomous insect and other nonvenomous arthropods, initial encounter: Secondary | ICD-10-CM | POA: Insufficient documentation

## 2022-04-24 DIAGNOSIS — Z79899 Other long term (current) drug therapy: Secondary | ICD-10-CM | POA: Insufficient documentation

## 2022-04-24 DIAGNOSIS — L03116 Cellulitis of left lower limb: Secondary | ICD-10-CM | POA: Insufficient documentation

## 2022-04-24 DIAGNOSIS — S70362A Insect bite (nonvenomous), left thigh, initial encounter: Secondary | ICD-10-CM | POA: Insufficient documentation

## 2022-04-24 DIAGNOSIS — I110 Hypertensive heart disease with heart failure: Secondary | ICD-10-CM | POA: Diagnosis not present

## 2022-04-24 DIAGNOSIS — I509 Heart failure, unspecified: Secondary | ICD-10-CM | POA: Diagnosis not present

## 2022-04-24 DIAGNOSIS — F17200 Nicotine dependence, unspecified, uncomplicated: Secondary | ICD-10-CM | POA: Diagnosis not present

## 2022-04-24 MED ORDER — FENTANYL CITRATE PF 50 MCG/ML IJ SOSY
50.0000 ug | PREFILLED_SYRINGE | Freq: Once | INTRAMUSCULAR | Status: AC
  Administered 2022-04-24: 50 ug via INTRAMUSCULAR
  Filled 2022-04-24: qty 1

## 2022-04-24 MED ORDER — LIDOCAINE HCL (PF) 1 % IJ SOLN
30.0000 mL | Freq: Once | INTRAMUSCULAR | Status: AC
  Administered 2022-04-24: 30 mL
  Filled 2022-04-24: qty 30

## 2022-04-24 MED ORDER — DOXYCYCLINE HYCLATE 100 MG PO CAPS
100.0000 mg | ORAL_CAPSULE | Freq: Two times a day (BID) | ORAL | 0 refills | Status: DC
Start: 2022-04-24 — End: 2024-04-18

## 2022-04-24 MED ORDER — DOXYCYCLINE HYCLATE 100 MG PO TABS
100.0000 mg | ORAL_TABLET | Freq: Once | ORAL | Status: AC
Start: 2022-04-24 — End: 2022-04-24
  Administered 2022-04-24: 100 mg via ORAL
  Filled 2022-04-24: qty 1

## 2022-04-24 NOTE — ED Triage Notes (Signed)
Pt states he was bit by a spider on Monday on his lower left buttock. Area is hot to the touch and swollen. No drainage noted.

## 2022-04-24 NOTE — ED Provider Notes (Signed)
[facility] EMERGENCY DEPARTMENT Provider Note   CSN: [id number] Arrival date & time: 04/24/22  1831     History  Chief Complaint  Patient presents with   Insect Bite    Aaron Hines is a 66 y.o. male has medical history significant for hypertension, HFrEF, tobacco abuse, previous ACS who presents with concern for spider bite on left lower buttock versus upper posterior left thigh on Monday.  Since then it has become hot painful today.  He has not noted any drainage but reports a blackened scab on top.  Patient did not see an insect but did feel short pain on Monday.  HPI     Home Medications Prior to Admission medications   Medication Sig Start Date End Date Taking? Authorizing Provider  doxycycline (VIBRAMYCIN) 100 MG capsule Take 1 capsule (100 mg total) by mouth 2 (two) times daily. 04/24/22  Yes Aaron Grau H, PA-C  albuterol (VENTOLIN HFA) 108 (90 Base) MCG/ACT inhaler as needed. 10/28/21   [provider]  atorvastatin (LIPITOR) 20 MG tablet Take 20 mg by mouth at bedtime. 09/09/21   [provider]  HYDROcodone-acetaminophen (NORCO) 5-325 MG tablet Take 1 tablet by mouth every 4 (four) hours as needed. 11/30/21   Mancel Bale, MD  hydrOXYzine (ATARAX) 10 MG tablet Take 10 mg by mouth as needed for anxiety. 07/16/21   [provider]  isosorbide mononitrate (IMDUR) 30 MG 24 hr tablet Take 30 mg by mouth daily. 11/18/21   [provider]  lisinopril (ZESTRIL) 20 MG tablet Take 40 mg by mouth daily. 11/18/21   [provider]  metoprolol tartrate (LOPRESSOR) 25 MG tablet Take 25 mg by mouth daily. 11/18/21   [provider]  spironolactone (ALDACTONE) 50 MG tablet Take 50 mg by mouth daily. 11/18/21   [provider]      Allergies    Farxiga [dapagliflozin]    Review of Systems   Review of Systems  Skin:  Positive for wound.  All other systems reviewed and are negative.   Physical Exam Updated Vital  Signs BP 111/69 (BP Location: Right Arm)   Pulse 91   Temp 98.2 F (36.8 C) (Oral)   Resp (!) 22   Ht 6' (1.829 m)   Wt 88.5 kg   SpO2 98%   BMI 26.45 kg/m  Physical Exam Vitals and nursing note reviewed.  Constitutional:      General: He is not in acute distress.    Appearance: Normal appearance.  HENT:     Head: Normocephalic and atraumatic.  Eyes:     General:        Right eye: No discharge.        Left eye: No discharge.  Cardiovascular:     Rate and Rhythm: Normal rate and regular rhythm.  Pulmonary:     Effort: Pulmonary effort is normal. No respiratory distress.  Musculoskeletal:        General: No deformity.  Skin:    General: Skin is warm and dry.     Comments: Patient with red indurated, swollen, painful localized cellulitis with black eschar and focal area on posterior left thigh.  Total size around 4 x 2 cm.  There is some fluctuance.  Ultrasound evaluation reveals a very small amount of drainable fluid  Neurological:     Mental Status: He is alert and oriented to person, place, and time.  Psychiatric:        Mood and Affect: Mood normal.  Behavior: Behavior normal.     ED Results / Procedures / Treatments   Labs (all labs ordered are listed, but only abnormal results are displayed) Labs Reviewed - No data to display  EKG None  Radiology No results found.  Procedures .Marland KitchenIncision and Drainage  Date/Time: 04/24/2022 10:09 PM  Performed by: Olene Floss, PA-C Authorized by: Olene Floss, PA-C   Consent:    Consent obtained:  Verbal   Consent given by:  Patient   Risks, benefits, and alternatives were discussed: yes     Risks discussed:  Bleeding, incomplete drainage, pain and infection   Alternatives discussed:  No treatment Universal protocol:    Procedure explained and questions answered to patient or proxy's satisfaction: yes     Patient identity confirmed:  Verbally with patient Location:    Type:  Abscess   Size:   4x2cm   Location:  Lower extremity   Lower extremity location:  Leg   Leg location:  L upper leg (posterior) Pre-procedure details:    Skin preparation:  Povidone-iodine Sedation:    Sedation type:  None Anesthesia:    Anesthesia method:  Local infiltration   Local anesthetic:  Lidocaine 1% w/o epi Procedure type:    Complexity:  Simple Procedure details:    Ultrasound guidance: no     Needle aspiration: no     Incision types:  Single straight   Incision depth:  Dermal   Wound management:  Probed and deloculated   Drainage:  Bloody and purulent   Drainage amount:  Scant   Wound treatment: single suture placed due to lack of clear abscess pocket and large incision site.   Packing materials:  None Post-procedure details:    Procedure completion:  Tolerated     Medications Ordered in ED Medications  lidocaine (PF) (XYLOCAINE) 1 % injection 30 mL (30 mLs Infiltration Given 04/24/22 2127)  doxycycline (VIBRA-TABS) tablet 100 mg (100 mg Oral Given 04/24/22 2128)  fentaNYL (SUBLIMAZE) injection 50 mcg (50 mcg Intramuscular Given 04/24/22 2127)    ED Course/ Medical Decision Making/ A&P                           Medical Decision Making Risk Prescription drug management.   This an overall well-appearing 66 year old gentleman who presents with concern for spider bite versus wound on left posterior upper leg since Monday.  On my assessment I see cellulitis versus abscess with some black eschar that could be from an insect bite.  Patient is otherwise stable with no fever, chills, the infection seems localized to around this area.  I evaluated with an ultrasound did not see a large fluid pocket, performed shared decision-making with the patient and discussed antibiotics alone versus attempted incision and drainage today.  Patient opts to attempt incision and drainage despite no large fluid pocket seen.  I think this is reasonable.  On incision and drainage there was no significant abscess  pocket, however I did express some retained blood as well as a small amount of purulent material.  I placed 1 suture that will need to be removed in 1 week over a large straight incision site, with loose closure, still plenty of room left for wound to drain. Discharged with doxycycline in stable condition at this time. Final Clinical Impression(s) / ED Diagnoses Final diagnoses:  Insect bite of left thigh, initial encounter  Cellulitis of left lower extremity    Rx / DC Orders ED Discharge  Orders          Ordered    doxycycline (VIBRAMYCIN) 100 MG capsule  2 times daily        04/24/22 2204              [place] 04/24/22 2212    Pricilla Loveless, MD 04/25/22 1635

## 2022-04-24 NOTE — Discharge Instructions (Addendum)
Please use Tylenol or ibuprofen for pain.  You may use 600 mg ibuprofen every 6 hours or 1000 mg of Tylenol every 6 hours.  You may choose to alternate between the 2.  This would be most effective.  Not to exceed 4 g of Tylenol within 24 hours.  Not to exceed 3200 mg ibuprofen 24 hours.  Take the entire course of antibiotics as I have prescribed, recommend taking them on a full stomach and wearing sunscreen while you are taking them.  Please follow-up for recheck and suture removal in around 1 week, please return if symptoms or not improving despite treatment.

## 2022-05-29 ENCOUNTER — Ambulatory Visit (HOSPITAL_COMMUNITY)
Admission: RE | Admit: 2022-05-29 | Discharge: 2022-05-29 | Disposition: A | Discharge status: Home / Self Care | Payer: 59 | Source: Ambulatory Visit | Attending: Internal Medicine | Admitting: Internal Medicine

## 2022-05-29 DIAGNOSIS — I5020 Unspecified systolic (congestive) heart failure: Secondary | ICD-10-CM | POA: Insufficient documentation

## 2022-05-29 LAB — ECHOCARDIOGRAM COMPLETE
AR max vel: 2.38 cm2
AV Area VTI: 2.16 cm2
AV Area mean vel: 2.13 cm2
AV Mean grad: 2 mmHg
AV Peak grad: 3.9 mmHg
Ao pk vel: 0.99 m/s
Area-P 1/2: 3.68 cm2
Calc EF: 34 %
MV VTI: 3.46 cm2
S' Lateral: 5.4 cm
Single Plane A2C EF: 34.3 %
Single Plane A4C EF: 31.3 %

## 2022-05-29 NOTE — Progress Notes (Signed)
*  PRELIMINARY RESULTS* Echocardiogram 2D Echocardiogram has been performed.  Aaron Hines 05/29/2022, 1:53 PM

## 2022-06-02 ENCOUNTER — Telehealth: Payer: Self-pay

## 2022-06-02 DIAGNOSIS — I5020 Unspecified systolic (congestive) heart failure: Secondary | ICD-10-CM

## 2022-06-02 MED ORDER — METOPROLOL SUCCINATE ER 25 MG PO TB24: 25.0000 mg | ORAL_TABLET | Freq: Every day | ORAL | 3 refills | Status: AC

## 2022-06-02 NOTE — Telephone Encounter (Signed)
I discussed echo results with patient. He will see EP for consult for severe LV dilatation.  I encouraged patient to stop smoking.  He will stop metoprolol tartrate and start metoprolol succinate 25 mg daily

## 2022-06-02 NOTE — Telephone Encounter (Signed)
-----   Message from Werner Lean, MD sent at 06/01/2022  3:01 PM EDT ----- Results: Similar EF form prior, severe LV dilation Plan: EP eval Smoking cessation Stop tartrate, trial of metoprolol 25 mg PO daily  Werner Lean, MD

## 2022-06-06 ENCOUNTER — Ambulatory Visit: Payer: 59 | Admitting: Internal Medicine

## 2022-06-26 ENCOUNTER — Ambulatory Visit: Payer: 59 | Admitting: Internal Medicine

## 2022-07-04 ENCOUNTER — Other Ambulatory Visit (HOSPITAL_COMMUNITY): Payer: Self-pay | Admitting: Gerontology

## 2022-07-04 ENCOUNTER — Encounter (HOSPITAL_COMMUNITY): Payer: Self-pay | Admitting: Gerontology

## 2022-07-04 DIAGNOSIS — Z87891 Personal history of nicotine dependence: Secondary | ICD-10-CM

## 2022-07-23 ENCOUNTER — Ambulatory Visit: Payer: 59 | Admitting: Internal Medicine

## 2022-08-05 ENCOUNTER — Encounter: Payer: Self-pay | Admitting: Internal Medicine

## 2022-08-05 ENCOUNTER — Ambulatory Visit: Payer: Medicare Other | Attending: Internal Medicine | Admitting: Internal Medicine

## 2022-08-05 VITALS — BP 122/64 | HR 78 | Ht 71.0 in | Wt 194.0 lb

## 2022-08-05 DIAGNOSIS — I5020 Unspecified systolic (congestive) heart failure: Secondary | ICD-10-CM

## 2022-08-05 NOTE — Progress Notes (Signed)
        HPI [name] is referred by Dr. Chandrasekhar for consideration for ICD insertion. 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 He has not had syncope. He has a narrow qrs. He denies anginal symptoms.       Allergies  Allergen Reactions   Farxiga [Dapagliflozin] Anaphylaxis      Throat swelling per pt              Current Outpatient Medications  Medication Sig Dispense Refill   albuterol (VENTOLIN HFA) 108 (90 Base) MCG/ACT inhaler as needed.       atorvastatin (LIPITOR) 20 MG tablet Take 20 mg by mouth at bedtime.       doxycycline (VIBRAMYCIN) 100 MG capsule Take 1 capsule (100 mg total) by mouth 2 (two) times daily. 14 capsule 0   HYDROcodone-acetaminophen (NORCO) 5-325 MG tablet Take 1 tablet by mouth every 4 (four) hours as needed. 20 tablet 0   hydrOXYzine (ATARAX) 10 MG tablet Take 10 mg by mouth as needed for anxiety.       isosorbide mononitrate (IMDUR) 30 MG 24 hr tablet Take 30 mg by mouth daily.       lisinopril (ZESTRIL) 20 MG tablet Take 40 mg by mouth daily.       metoprolol succinate (TOPROL XL) 25 MG 24 hr tablet Take 1 tablet (25 mg total) by mouth daily. 90 tablet 3   nitroGLYCERIN (NITROSTAT) 0.4 MG SL tablet 1 tablet Sublingual 4 times a day as needed for 30 days       sertraline (ZOLOFT) 50 MG tablet Take 50 mg by mouth daily.       spironolactone (ALDACTONE) 50 MG tablet Take 50 mg by mouth daily.        No current facility-administered medications for this visit.            Past Medical History:  Diagnosis Date   CAD (coronary artery disease)     Hypertension        ROS:    All systems reviewed and negative except as noted in the HPI.          Past Surgical History:  Procedure Laterality Date   arm fracture        as a child. left lower arm   CORONARY ARTERY BYPASS GRAFT                 Family History  Problem Relation Age of  Onset   Hypertension Mother     Cancer Mother     CAD Mother     Hypertension Father     Coronary artery disease Father          Social History         Socioeconomic History   Marital status: Legally Separated      Spouse name: Not on file   Number of children: Not on file   Years of education: Not on file   Highest education level: Not on file  Occupational History   Not on file  Tobacco Use   Smoking status: Every Day      Packs/day: 1.00      Types: Cigarettes   Smokeless tobacco: Never  Vaping Use   Vaping Use: Never used  Substance and Sexual Activity   Alcohol use: Not Currently      Comment:   occasional beer,mixed drink   Drug use: Never      Types: Cocaine      Comment: last used over 1 week ago   Sexual activity: Not on file  Other Topics Concern   Not on file  Social History Narrative   Not on file    Social Determinants of Health    Financial Resource Strain: Not on file  Food Insecurity: Not on file  Transportation Needs: Not on file  Physical Activity: Not on file  Stress: Not on file  Social Connections: Not on file  Intimate Partner Violence: Not on file        BP 122/64   Pulse 78   Ht 5' 11" (1.803 m)   Wt 194 lb (88 kg)   SpO2 97%   BMI 27.06 kg/m    Physical Exam:   Well appearing NAD HEENT: Unremarkable Neck:  No JVD, no thyromegally Lymphatics:  No adenopathy Back:  No CVA tenderness Lungs:  Clear with no wheezes HEART:  Regular rate rhythm, no murmurs, no rubs, no clicks Abd:  soft, positive bowel sounds, no organomegally, no rebound, no guarding Ext:  2 plus pulses, no edema, no cyanosis, no clubbing Skin:  No rashes no nodules Neuro:  CN II through XII intact, motor grossly intact   Assess/Plan: ICM - I have discussed the treatment options with the patient and I have discussed the risks/ benefits/goals/expectations of ICD insertion and he will call us if he wishes to proceed. For now he will continue his medical  therapy. 2. HTN - his bp is well controlled.    Aerion Bagdasarian,MD 

## 2022-08-05 NOTE — Patient Instructions (Signed)
Medication Instructions:  Your physician recommends that you continue on your current medications as directed. Please refer to the Current Medication list given to you today.  *If you need a refill on your cardiac medications before your next appointment, please call your pharmacy*   Lab Work: Your physician recommends that you return for lab work one week prior to ICD placement.   If you have labs (blood work) drawn today and your tests are completely normal, you will receive your results only by: MyChart Message (if you have MyChart) OR A paper copy in the mail If you have any lab test that is abnormal or we need to change your treatment, we will call you to review the results.   Testing/Procedures: Your physician has recommended that you have a defibrillator inserted. An implantable cardioverter defibrillator (ICD) is a small device that is placed in your chest or, in rare cases, your abdomen. This device uses electrical pulses or shocks to help control life-threatening, irregular heartbeats that could lead the heart to suddenly stop beating (sudden cardiac arrest). Leads are attached to the ICD that goes into your heart. This is done in the hospital and usually requires an overnight stay. Please see the instruction sheet given to you today for more information.  Dates are: 1/16, 1/22, 1/29    Follow-Up: At [facility], you and your health needs are our priority.  As part of our continuing mission to provide you with exceptional heart care, we have created designated Provider Care Teams.  These Care Teams include your primary Cardiologist (physician) and Advanced Practice Providers (APPs -  Physician Assistants and Nurse Practitioners) who all work together to provide you with the care you need, when you need it.  We recommend signing up for the patient portal called "MyChart".  Sign up information is provided on this After Visit Summary.  MyChart is used to connect with patients  for Virtual Visits (Telemedicine).  Patients are able to view lab/test results, encounter notes, upcoming appointments, etc.  Non-urgent messages can be sent to your provider as well.   To learn more about what you can do with MyChart, go to [email or web address].    Your next appointment:    Pending ICD placement   The format for your next appointment:   In Person  Provider:   Lewayne Bunting, MD    Other Instructions Thank you for choosing [facility]!    Important Information About Sugar

## 2022-08-15 ENCOUNTER — Encounter (HOSPITAL_COMMUNITY): Payer: Self-pay

## 2022-08-15 ENCOUNTER — Ambulatory Visit (HOSPITAL_COMMUNITY): Admission: RE | Admit: 2022-08-15 | Payer: Medicare Other | Source: Ambulatory Visit

## 2022-09-02 ENCOUNTER — Ambulatory Visit (HOSPITAL_COMMUNITY)
Admission: RE | Admit: 2022-09-02 | Discharge: 2022-09-02 | Disposition: A | Discharge status: Home / Self Care | Payer: Medicare Other | Source: Ambulatory Visit | Attending: Gerontology | Admitting: Gerontology

## 2022-09-02 DIAGNOSIS — Z87891 Personal history of nicotine dependence: Secondary | ICD-10-CM | POA: Insufficient documentation

## 2022-09-25 ENCOUNTER — Encounter: Payer: Self-pay | Admitting: Internal Medicine

## 2022-09-25 ENCOUNTER — Ambulatory Visit: Payer: Medicare Other | Attending: Internal Medicine | Admitting: Internal Medicine

## 2022-09-25 VITALS — BP 130/76 | HR 92 | Ht 71.0 in | Wt 196.6 lb

## 2022-09-25 DIAGNOSIS — I5020 Unspecified systolic (congestive) heart failure: Secondary | ICD-10-CM

## 2022-09-25 DIAGNOSIS — Z01818 Encounter for other preprocedural examination: Secondary | ICD-10-CM | POA: Diagnosis not present

## 2022-09-25 MED ORDER — NITROGLYCERIN 0.4 MG SL SUBL: SUBLINGUAL_TABLET | SUBLINGUAL | 3 refills | Status: AC

## 2022-09-25 NOTE — Progress Notes (Signed)
HPI [name] returns today for followup. He is a pleasant 67 yo man with a h/o chronic systolic heart failure, CAD, tobacco abuse who I saw 2 months ago. At the time I recommended ICD insertion. He returns today to discuss his treatment options. He has not had syncope.  Allergies  Allergen Reactions   Farxiga [Dapagliflozin] Anaphylaxis    Throat swelling per pt     Current Outpatient Medications  Medication Sig Dispense Refill   albuterol (VENTOLIN HFA) 108 (90 Base) MCG/ACT inhaler as needed.     atorvastatin (LIPITOR) 20 MG tablet Take 20 mg by mouth at bedtime.     doxycycline (VIBRAMYCIN) 100 MG capsule Take 1 capsule (100 mg total) by mouth 2 (two) times daily. 14 capsule 0   HYDROcodone-acetaminophen (NORCO) 5-325 MG tablet Take 1 tablet by mouth every 4 (four) hours as needed. 20 tablet 0   hydrOXYzine (ATARAX) 10 MG tablet Take 10 mg by mouth as needed for anxiety.     isosorbide mononitrate (IMDUR) 30 MG 24 hr tablet Take 30 mg by mouth daily.     lisinopril (ZESTRIL) 20 MG tablet Take 40 mg by mouth daily.     metoprolol succinate (TOPROL XL) 25 MG 24 hr tablet Take 1 tablet (25 mg total) by mouth daily. 90 tablet 3   nitroGLYCERIN (NITROSTAT) 0.4 MG SL tablet 1 tablet Sublingual 4 times a day as needed for 30 days     sertraline (ZOLOFT) 50 MG tablet Take 50 mg by mouth daily.     spironolactone (ALDACTONE) 50 MG tablet Take 50 mg by mouth daily.     traZODone (DESYREL) 50 MG tablet Take 50 mg by mouth at bedtime.     No current facility-administered medications for this visit.     Past Medical History:  Diagnosis Date   CAD (coronary artery disease)    Hypertension     ROS:   All systems reviewed and negative except as noted in the HPI.   Past Surgical History:  Procedure Laterality Date   arm fracture     as a child. left lower arm   CORONARY ARTERY BYPASS GRAFT       Family History  Problem Relation Age of Onset   Hypertension Mother     Cancer Mother    CAD Mother    Hypertension Father    Coronary artery disease Father      Social History   Socioeconomic History   Marital status: Legally Separated    Spouse name: Not on file   Number of children: Not on file   Years of education: Not on file   Highest education level: Not on file  Occupational History   Not on file  Tobacco Use   Smoking status: Every Day    Packs/day: 1.00    Types: Cigarettes   Smokeless tobacco: Never  Vaping Use   Vaping Use: Never used  Substance and Sexual Activity   Alcohol use: Not Currently    Comment: occasional beer,mixed drink   Drug use: Never    Types: Cocaine    Comment: last used over 1 week ago   Sexual activity: Not on file  Other Topics Concern   Not on file  Social History Narrative   Not on file   Social Determinants of Health   Financial Resource Strain: Not on file  Food Insecurity: Not on file  Transportation Needs: Not on file  Physical Activity: Not on file  Stress: Not on file  Social Connections: Not on file  Intimate Partner Violence: Not on file     Ht 5\' 11"  (1.803 m)   Wt 196 lb 9.6 oz (89.2 kg)   BMI 27.42 kg/m   Physical Exam:  Well appearing NAD HEENT: Unremarkable Neck:  No JVD, no thyromegally Lymphatics:  No adenopathy Back:  No CVA tenderness Lungs:  Clear with no wheezes HEART:  Regular rate rhythm, no murmurs, no rubs, no clicks Abd:  soft, positive bowel sounds, no organomegally, no rebound, no guarding Ext:  2 plus pulses, no edema, no cyanosis, no clubbing Skin:  No rashes no nodules Neuro:  CN II through XII intact, motor grossly intact  DEVICE  Normal device function.  See PaceArt for details.   Assess/Plan: Chronic systolic heart failure - I have discussed the indication for ICD insertion with the patient and the risks/benefits/goals/expectations and he wishes to proceed.  Aaron Overlie Khloee Garza,MD

## 2022-09-25 NOTE — Patient Instructions (Signed)
Medication Instructions:  Your physician recommends that you continue on your current medications as directed. Please refer to the Current Medication list given to you today.  *If you need a refill on your cardiac medications before your next appointment, please call your pharmacy*   Lab Work: Your physician recommends that you return for lab work in: [place], BMET   If you have labs (blood work) drawn today and your tests are completely normal, you will receive your results only by: [place] (if you have [place]) OR A paper copy in the mail If you have any lab test that is abnormal or we need to change your treatment, we will call you to review the results.   Testing/Procedures: Your physician has recommended that you have a defibrillator inserted. An implantable cardioverter defibrillator (ICD) is a small device that is placed in your chest or, in rare cases, your abdomen. This device uses electrical pulses or shocks to help control life-threatening, irregular heartbeats that could lead the heart to suddenly stop beating (sudden cardiac arrest). Leads are attached to the ICD that goes into your heart. This is done in the hospital and usually requires an overnight stay. Please see the instruction sheet given to you today for more information.    Follow-Up: At [facility], you and your health needs are our priority.  As part of our continuing mission to provide you with exceptional heart care, we have created designated Provider Care Teams.  These Care Teams include your primary Cardiologist (physician) and Advanced Practice Providers (APPs -  Physician Assistants and Nurse Practitioners) who all work together to provide you with the care you need, when you need it.  We recommend signing up for the patient portal called "MyChart".  Sign up information is provided on this After Visit Summary.  MyChart is used to connect with patients for Virtual Visits (Telemedicine).  Patients are  able to view lab/test results, encounter notes, upcoming appointments, etc.  Non-urgent messages can be sent to your provider as well.   To learn more about what you can do with MyChart, go to [email or web address].    Your next appointment:   3 month(s)  Provider:   Cristopher Peru, MD    Other Instructions Thank you for choosing [facility]!     Implantable Device Instructions    Aaron Hines  09/25/2022  You are scheduled for a Implantable cardioverter defibrilator (ICD) on Monday, March 4 with Dr. Cristopher Peru.  1. Pre procedure Lab testing:  Have labs done at [facility] on October 13, 2022.     2. Please arrive at the Main Entrance A at [facility]: [address] [place], [place] [address] on March 4 at 9:00 AM (This time is two hours before your procedure to ensure your preparation). Free valet parking service is available. You will check in at ADMITTING. The support person will be asked to wait in the waiting room.  It is OK to have someone drop you off and come back when you are ready to be discharged.        Special note: Every effort is made to have your procedure done on time. Please understand that emergencies sometimes delay  scheduled procedures.  3.  No eating or drinking after midnight prior to procedure.     4.  Medication instructions:  On the morning of your procedure you may take all of you medications.    5.  The night before your procedure  and the morning of your procedure scrub your neck/chest with CHG surgical scrub.  See instruction letter.  6. Plan to go home the same day, you will only stay overnight if medically necessary. 7.  You MUST have a responsible adult to drive you home. 8.   An adult MUST be with you the first 24 hours after you arrive home. 80..  Bring a current list of your medications, and the last time and date medication taken. 10. Bring ID and current insurance cards. 11. .Please wear clothes that are easy to  get on and off and wear slip-on shoes.    You will follow up with the [facility] 10-14 days after your procedure.  You will follow up with Dr. Cristopher Peru 91 days after your procedure.  These appointments will be made for you.   * If you have ANY questions after you get home, please call the office at [phone] or send a MyChart message.  FYI: For your safety, and to allow [place] to monitor your vital signs accurately during the surgery/procedure we request that if you have artificial nails, gel coating, SNS etc. Please have those removed prior to your surgery/procedure. Not having the nail coverings /polish removed may result in cancellation or delay of your surgery/procedure.    [place] - Preparing For Surgery    Before surgery, you can play an important role. Because skin is not sterile, your skin needs to be as free of germs as possible. You can reduce the number of germs on your skin by washing with CHG (chlorahexidine gluconate) Soap before surgery.  CHG is an antiseptic cleaner which kills germs and bonds with the skin to continue killing germs even after washing.  Please do not use if you have an allergy to CHG or antibacterial soaps.  If your skin becomes reddened/irritated stop using the CHG.   Do not shave (including legs and underarms) for at least 48 hours prior to first CHG shower.  It is OK to shave your face.  Please follow these instructions carefully:  1.  Shower the night before surgery and the morning of surgery with CHG.  2.  If you choose to wash your hair, wash your hair first as usual with your normal shampoo.  3.  After you shampoo, rinse your hair and body thoroughly to remove the shampoo.  4.  Use CHG as you would any other liquid soap.  You can apply CHG directly to the skin and wash gently with a clean washcloth. 5.  Apply the CHG Soap to your body ONLY FROM THE NECK DOWN.  Do not use on open wounds or open sores.  Avoid contact with your eyes,  ears, mouth and genitals (private parts).  Wash genitals (private parts) with your normal soap.  6.  Wash thoroughly, paying special attention to the area where your surgery will be performed.  7.  Thoroughly rinse your body with warm water from the neck down.   8.  DO NOT shower/wash with your normal soap after using and rinsing off the CHG soap.  9.  Pat yourself dry with a clean towel.           10.  Wear clean pajamas.           11.  Place clean sheets on your bed the night of your first shower and do not sleep with pets.  Day of Surgery: Do not apply any deodorants/lotions.  Please wear clean clothes  to the hospital/surgery center.

## 2022-10-03 IMAGING — DX DG SHOULDER 2+V*L*
3 series · 3 of 3 positions shown · non-contrast
Comparison: None.

CLINICAL DATA: LEFT shoulder pain for the past 2 days. No known
injury.

EXAM:
LEFT SHOULDER - 2+ VIEW

[shoulder grashey]
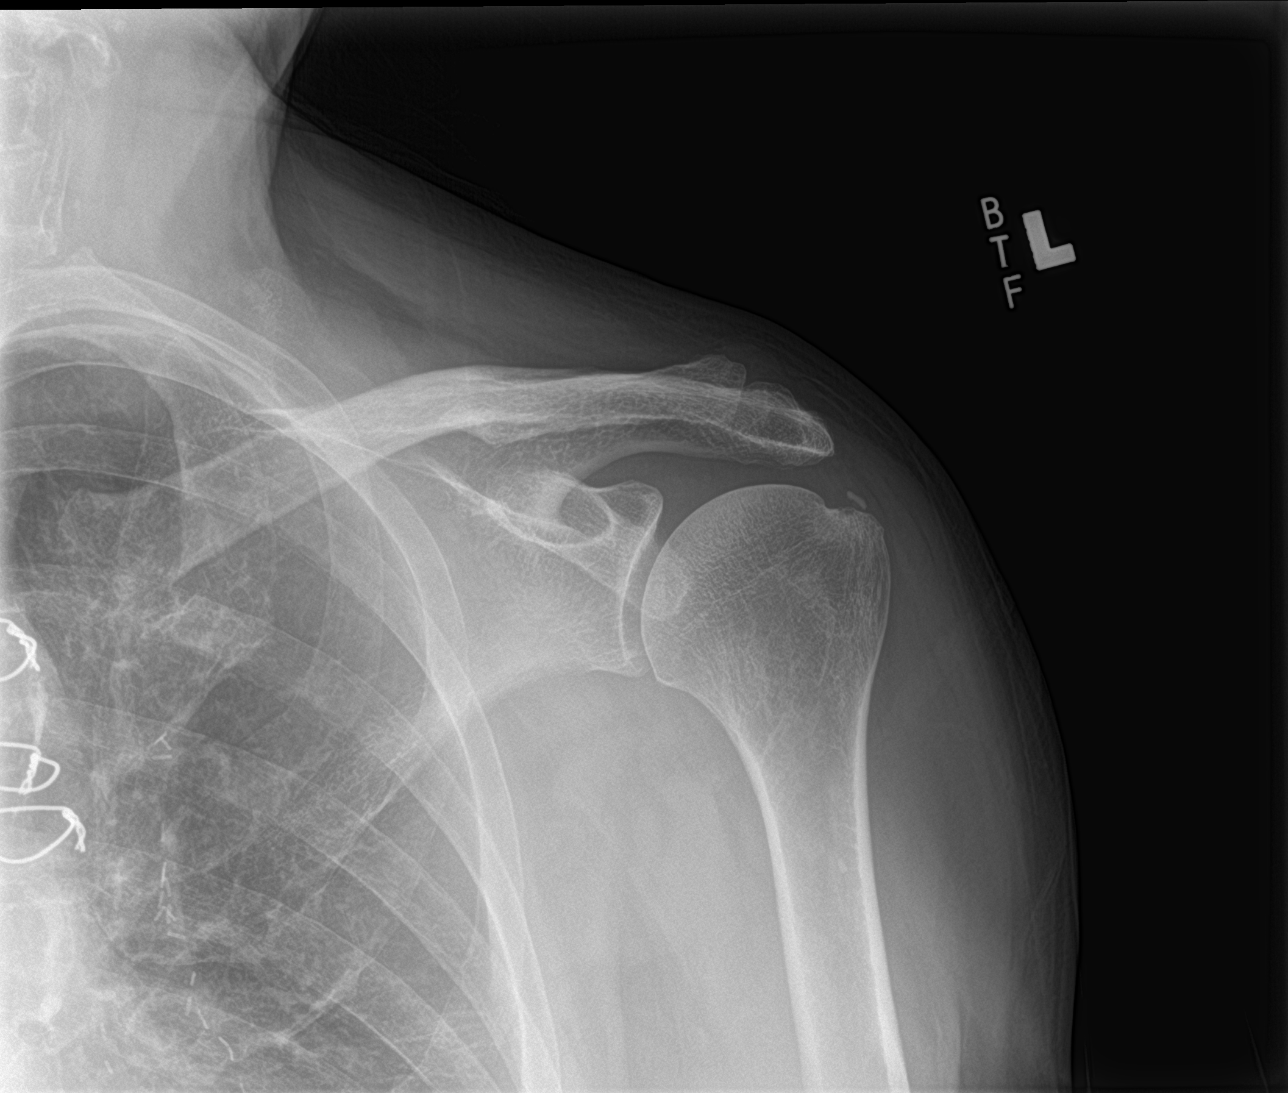

[shoulder y view]
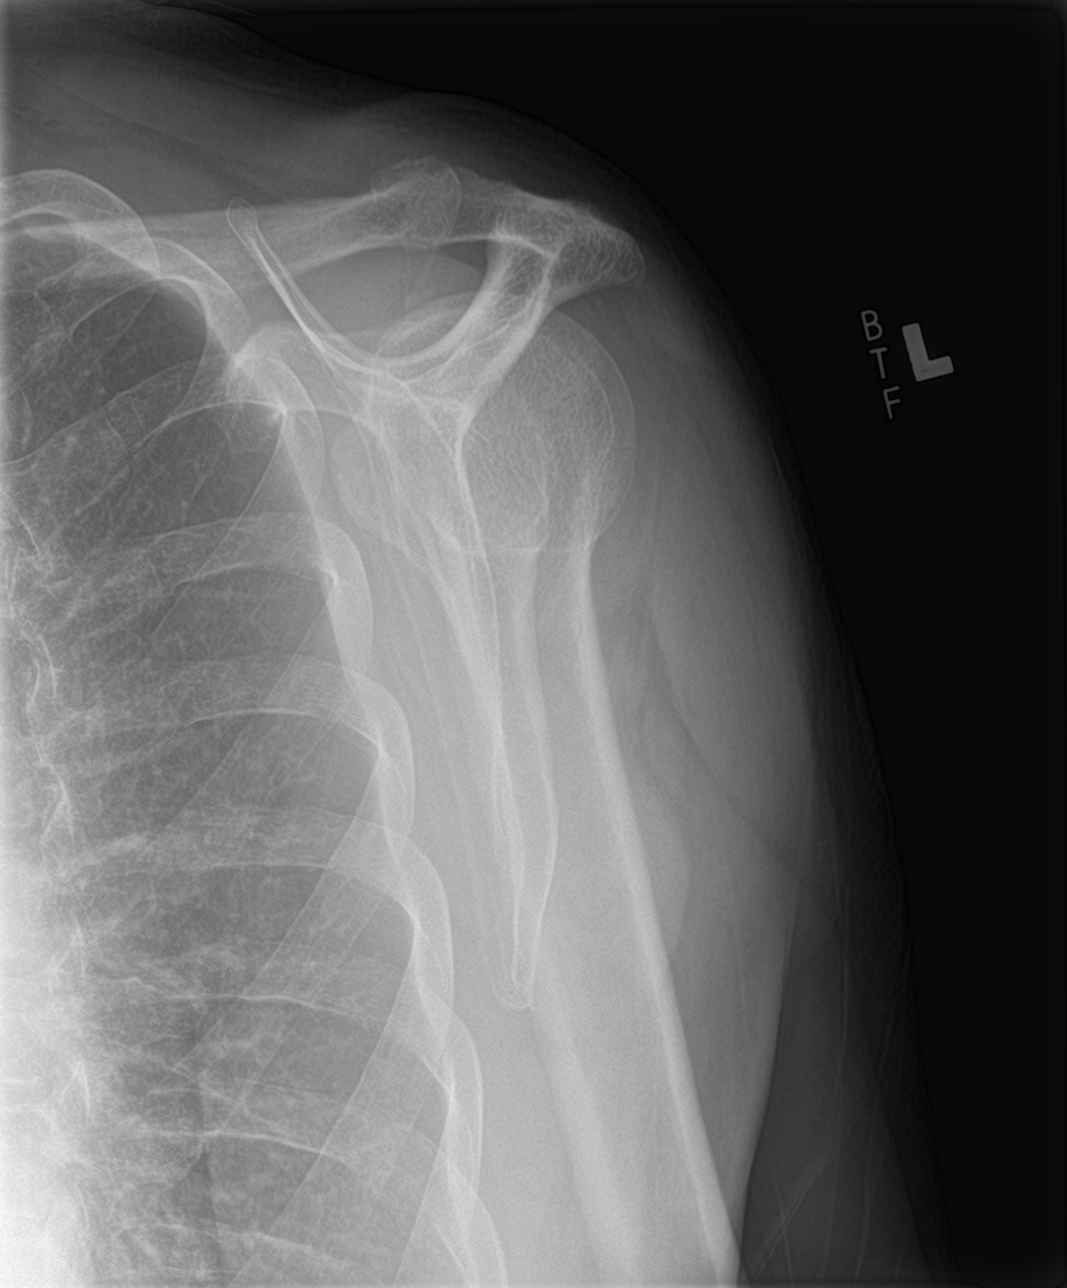

[shoulder axillary]
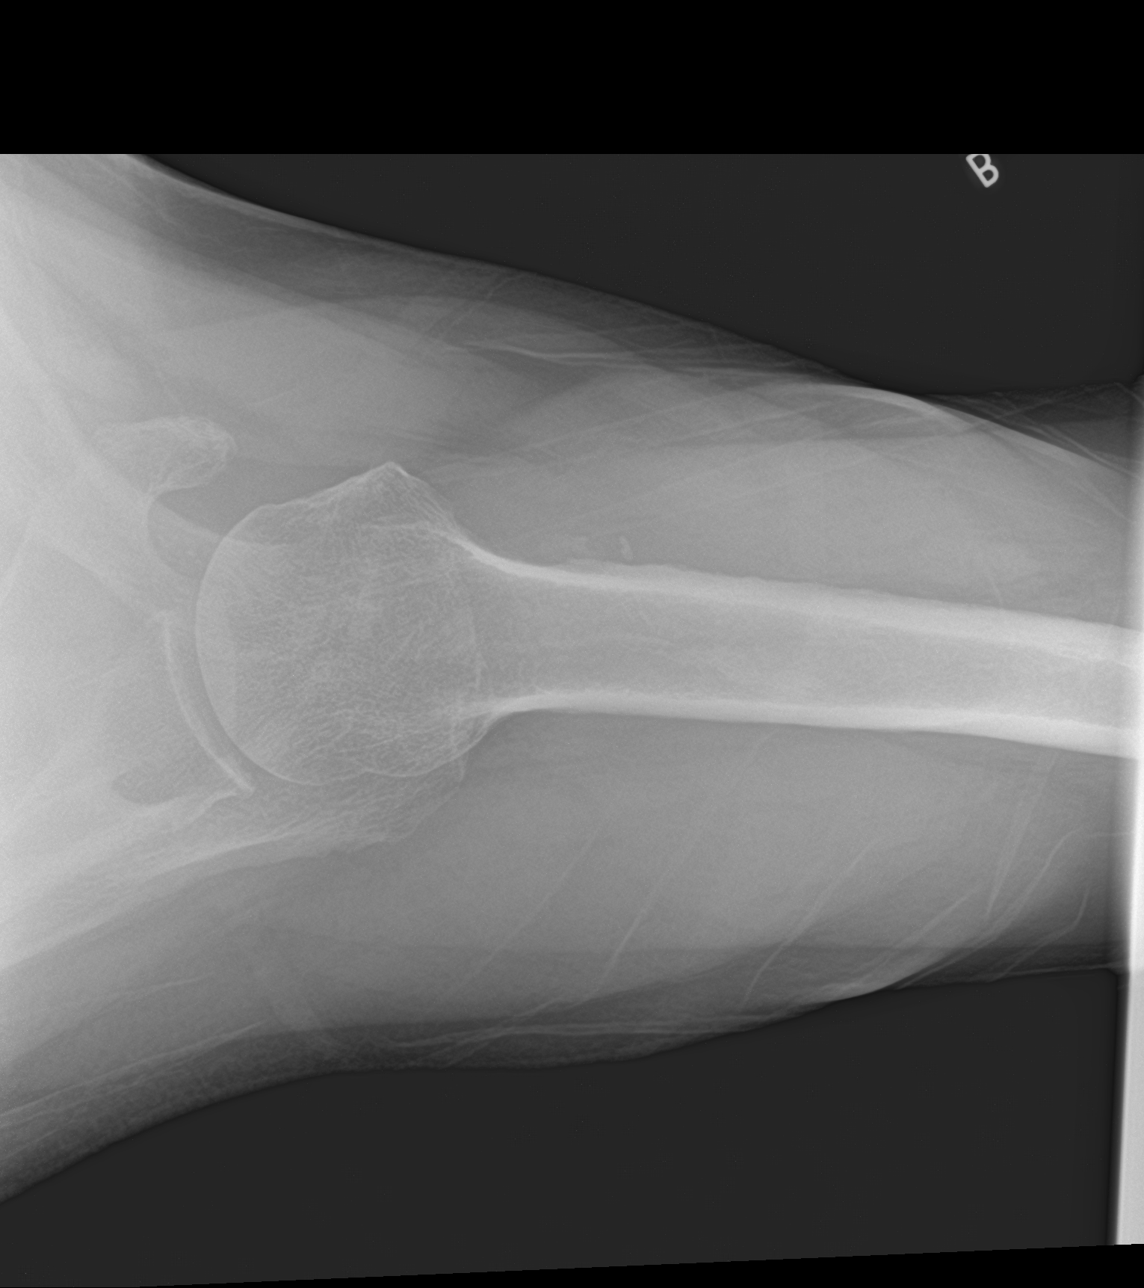

[3 of 3 positions shown; findings below may reference images not displayed]

FINDINGS: Osseous alignment is normal. No acute-appearing osseous abnormality.
No significant degenerative change at the glenohumeral or
acromioclavicular joint spaces. Thin/small calcific density
overlying the humeral tuberosity, compatible with mild/early
calcific tendinopathy of the rotator cuff.
IMPRESSION: 1. No acute findings.
2. Mild/early calcific tendinopathy of the rotator cuff at the
insertion on the humeral head.

## 2022-10-13 ENCOUNTER — Other Ambulatory Visit (HOSPITAL_COMMUNITY)
Admission: RE | Admit: 2022-10-13 | Discharge: 2022-10-13 | Disposition: A | Discharge status: Home / Self Care | Payer: Medicare Other | Source: Ambulatory Visit | Attending: Internal Medicine | Admitting: Internal Medicine

## 2022-10-13 DIAGNOSIS — I5020 Unspecified systolic (congestive) heart failure: Secondary | ICD-10-CM | POA: Insufficient documentation

## 2022-10-13 DIAGNOSIS — Z01818 Encounter for other preprocedural examination: Secondary | ICD-10-CM | POA: Insufficient documentation

## 2022-10-13 LAB — CBC
HCT: 49.4 % (ref 39.0–52.0)
Hemoglobin: 16.4 g/dL (ref 13.0–17.0)
MCH: 29.9 pg (ref 26.0–34.0)
MCHC: 33.2 g/dL (ref 30.0–36.0)
MCV: 90 fL (ref 80.0–100.0)
Platelets: 291 10*3/uL (ref 150–400)
RBC: 5.49 MIL/uL (ref 4.22–5.81)
RDW: 13.5 % (ref 11.5–15.5)
WBC: 9.3 10*3/uL (ref 4.0–10.5)
nRBC: 0 % (ref 0.0–0.2)

## 2022-10-13 LAB — BASIC METABOLIC PANEL
Anion gap: 7 (ref 5–15)
BUN: 17 mg/dL (ref 8–23)
CO2: 26 mmol/L (ref 22–32)
Calcium: 9.4 mg/dL (ref 8.9–10.3)
Chloride: 107 mmol/L (ref 98–111)
Creatinine, Ser: 0.88 mg/dL (ref 0.61–1.24)
GFR, Estimated: >60 mL/min (ref >60)
Glucose, Bld: 125 mg/dL — ABNORMAL HIGH (ref 70–99)
Potassium: 4.4 mmol/L (ref 3.5–5.1)
Sodium: 140 mmol/L (ref 135–145)

## 2022-10-24 NOTE — Pre-Procedure Instructions (Signed)
Spoke with daughter Lawerance Bach.  Reviewed the following items: Arrival time 0930 Nothing to eat or drink after midnight No meds AM of procedure Responsible person to drive you home and stay with you for 24 hrs Wash with special soap night before and morning of procedure

## 2022-10-27 ENCOUNTER — Ambulatory Visit (HOSPITAL_COMMUNITY): Payer: Medicare Other

## 2022-10-27 ENCOUNTER — Encounter (HOSPITAL_COMMUNITY)
Admission: RE | Disposition: A | Discharge status: Home / Self Care | Payer: Self-pay | Source: Ambulatory Visit | Attending: Internal Medicine

## 2022-10-27 ENCOUNTER — Other Ambulatory Visit: Payer: Self-pay

## 2022-10-27 ENCOUNTER — Ambulatory Visit (HOSPITAL_COMMUNITY)
Admission: RE | Admit: 2022-10-27 | Discharge: 2022-10-27 | Disposition: A | Discharge status: Home / Self Care | Payer: Medicare Other | Source: Ambulatory Visit | Attending: Internal Medicine | Admitting: Internal Medicine

## 2022-10-27 DIAGNOSIS — I2510 Atherosclerotic heart disease of native coronary artery without angina pectoris: Secondary | ICD-10-CM | POA: Insufficient documentation

## 2022-10-27 DIAGNOSIS — I110 Hypertensive heart disease with heart failure: Secondary | ICD-10-CM | POA: Insufficient documentation

## 2022-10-27 DIAGNOSIS — I5022 Chronic systolic (congestive) heart failure: Secondary | ICD-10-CM | POA: Diagnosis not present

## 2022-10-27 DIAGNOSIS — I255 Ischemic cardiomyopathy: Secondary | ICD-10-CM

## 2022-10-27 DIAGNOSIS — Z8249 Family history of ischemic heart disease and other diseases of the circulatory system: Secondary | ICD-10-CM | POA: Diagnosis not present

## 2022-10-27 HISTORY — PX: ICD IMPLANT: EP1208

## 2022-10-27 SURGERY — ICD IMPLANT

## 2022-10-27 MED ORDER — ACETAMINOPHEN 325 MG PO TABS: 325.0000 mg | ORAL_TABLET | ORAL | Status: DC | PRN

## 2022-10-27 MED ORDER — CEFAZOLIN SODIUM-DEXTROSE 2-4 GM/100ML-% IV SOLN
2.0000 g | INTRAVENOUS | Status: AC
  Administered 2022-10-27: 2 g via INTRAVENOUS

## 2022-10-27 MED ORDER — FENTANYL CITRATE (PF) 100 MCG/2ML IJ SOLN
INTRAMUSCULAR | Status: DC | PRN
  Administered 2022-10-27: 25 ug via INTRAVENOUS

## 2022-10-27 MED ORDER — HEPARIN (PORCINE) IN NACL 1000-0.9 UT/500ML-% IV SOLN
INTRAVENOUS | Status: DC | PRN
  Administered 2022-10-27: 500 mL

## 2022-10-27 MED ORDER — CHLORHEXIDINE GLUCONATE 4 % EX LIQD
4.0000 | Freq: Once | CUTANEOUS | Status: DC
  Filled 2022-10-27: qty 60

## 2022-10-27 MED ORDER — CEFAZOLIN SODIUM-DEXTROSE 2-4 GM/100ML-% IV SOLN
INTRAVENOUS | Status: AC
  Filled 2022-10-27: qty 100

## 2022-10-27 MED ORDER — MIDAZOLAM HCL 5 MG/5ML IJ SOLN
INTRAMUSCULAR | Status: DC | PRN
  Administered 2022-10-27: 2 mg via INTRAVENOUS

## 2022-10-27 MED ORDER — SODIUM CHLORIDE 0.9 % IV SOLN
80.0000 mg | INTRAVENOUS | Status: AC
  Administered 2022-10-27: 80 mg

## 2022-10-27 MED ORDER — LIDOCAINE HCL (PF) 1 % IJ SOLN
INTRAMUSCULAR | Status: DC | PRN
  Administered 2022-10-27: 55 mL

## 2022-10-27 MED ORDER — SODIUM CHLORIDE 0.9 % IV SOLN
INTRAVENOUS | Status: DC
Start: 2022-10-27 — End: 2022-10-27

## 2022-10-27 MED ORDER — POVIDONE-IODINE 10 % EX SWAB
2.0000 | Freq: Once | CUTANEOUS | Status: AC
  Administered 2022-10-27: 2 via TOPICAL

## 2022-10-27 MED ORDER — CEFAZOLIN SODIUM-DEXTROSE 1-4 GM/50ML-% IV SOLN
1.0000 g | INTRAVENOUS | Status: AC
  Administered 2022-10-27: 1 g via INTRAVENOUS
  Filled 2022-10-27: qty 50

## 2022-10-27 MED ORDER — SODIUM CHLORIDE 0.9 % IV SOLN
INTRAVENOUS | Status: AC
  Filled 2022-10-27: qty 2

## 2022-10-27 MED ORDER — MIDAZOLAM HCL 5 MG/5ML IJ SOLN
INTRAMUSCULAR | Status: AC
  Filled 2022-10-27: qty 5

## 2022-10-27 MED ORDER — FENTANYL CITRATE (PF) 100 MCG/2ML IJ SOLN
INTRAMUSCULAR | Status: AC
  Filled 2022-10-27: qty 2

## 2022-10-27 MED ORDER — LIDOCAINE HCL 1 % IJ SOLN
INTRAMUSCULAR | Status: AC
  Filled 2022-10-27: qty 60

## 2022-10-27 MED ORDER — ONDANSETRON HCL 4 MG/2ML IJ SOLN: 4.0000 mg | Freq: Four times a day (QID) | INTRAMUSCULAR | Status: DC | PRN

## 2022-10-27 SURGICAL SUPPLY — 6 items
CABLE SURGICAL S-101-97-12 (CABLE) ×2 IMPLANT
ICD ACTICOR DX (ICD Generator) IMPLANT
LEAD PLEXA 65/15 (Lead) IMPLANT
PAD DEFIB RADIO PHYSIO CONN (PAD) ×2 IMPLANT
SHEATH 8FR PRELUDE SNAP 13 (SHEATH) IMPLANT
TRAY PACEMAKER INSERTION (PACKS) ×2 IMPLANT

## 2022-10-27 NOTE — Discharge Instructions (Signed)
After Your ICD (Implantable Cardiac Defibrillator)   You have a [employer] ICD  ACTIVITY Do not lift your arm above shoulder height for 1 week after your procedure. After 7 days, you may progress as below.  You should remove your sling 24 hours after your procedure, unless otherwise instructed by your provider.     Monday November 03, 2022  Tuesday November 04, 2022 Wednesday November 05, 2022 Thursday November 06, 2022   Do not lift, push, pull, or carry anything over 10 pounds with the affected arm until 6 weeks (Monday December 08, 2022 ) after your procedure.   You may drive AFTER your wound check, unless you have been told otherwise by your provider.   Ask your healthcare provider when you can go back to work   INCISION/Dressing  If large square, outer bandage is left in place, this can be removed after 24 hours from your procedure. Do not remove steri-strips or glue as below.   Monitor your defibrillator site for redness, swelling, and drainage. Call the device clinic at [phone] if you experience these symptoms or fever/chills.  If your incision is sealed with Steri-strips or staples, you may shower 7 days after your procedure or when told by your provider. Do not remove the steri-strips or let the shower hit directly on your site. You may wash around your site with soap and water.    If you were discharged in a sling, please do not wear this during the day more than 48 hours after your surgery unless otherwise instructed. This may increase the risk of stiffness and soreness in your shoulder.   Avoid lotions, ointments, or perfumes over your incision until it is well-healed.  You may use a hot tub or a pool AFTER your wound check appointment if the incision is completely closed.  Your ICD is designed to protect you from life threatening heart rhythms. Because of this, you may receive a shock.   1 shock with no symptoms:  Call the office during business hours. 1 shock with symptoms  (chest pain, chest pressure, dizziness, lightheadedness, shortness of breath, overall feeling unwell):  Call 911. If you experience 2 or more shocks in 24 hours:  Call 911. If you receive a shock, you should not drive for 6 months per the [place] DMV IF you receive appropriate therapy from your ICD.   ICD Alerts:  Some alerts are vibratory and others beep. These are NOT emergencies. Please call our office to let us know. If this occurs at night or on weekends, it can wait until the next business day. Send a remote transmission.  If your device is capable of reading fluid status (for heart failure), you will be offered monthly monitoring to review this with you.   DEVICE MANAGEMENT Remote monitoring is used to monitor your ICD from home. This monitoring is scheduled every 91 days by our office. It allows [place] to keep an eye on the functioning of your device to ensure it is working properly. You will routinely see your Electrophysiologist annually (more often if necessary).   You should receive your ID card for your new device in 4-8 weeks. Keep this card with you at all times once received. Consider wearing a medical alert bracelet or necklace.  Your ICD  may be MRI compatible. This will be discussed at your next office visit/wound check.  You should avoid contact with strong electric or magnetic fields.   Do not use amateur (ham) radio equipment or electric (arc)  welding torches. MP3 player headphones with magnets should not be used. Some devices are safe to use if held at least 12 inches (30 cm) from your defibrillator. These include power tools, lawn mowers, and speakers. If you are unsure if something is safe to use, ask your health care provider.  When using your cell phone, hold it to the ear that is on the opposite side from the defibrillator. Do not leave your cell phone in a pocket over the defibrillator.  You may safely use electric blankets, heating pads, computers, and microwave ovens.  Call  the office right away if: You have chest pain. You feel more than one shock. You feel more short of breath than you have felt before. You feel more light-headed than you have felt before. Your incision starts to open up.  This information is not intended to replace advice given to you by your health care provider. Make sure you discuss any questions you have with your health care provider.

## 2022-10-27 NOTE — H&P (Signed)
HPI Aaron Hines is referred by Dr. Gasper Sells for consideration for ICD insertion. 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 He has not had syncope. He has a narrow qrs. He denies anginal symptoms.       Allergies  Allergen Reactions   Farxiga [Dapagliflozin] Anaphylaxis      Throat swelling per pt              Current Outpatient Medications  Medication Sig Dispense Refill   albuterol (VENTOLIN HFA) 108 (90 Base) MCG/ACT inhaler as needed.       atorvastatin (LIPITOR) 20 MG tablet Take 20 mg by mouth at bedtime.       doxycycline (VIBRAMYCIN) 100 MG capsule Take 1 capsule (100 mg total) by mouth 2 (two) times daily. 14 capsule 0   HYDROcodone-acetaminophen (NORCO) 5-325 MG tablet Take 1 tablet by mouth every 4 (four) hours as needed. 20 tablet 0   hydrOXYzine (ATARAX) 10 MG tablet Take 10 mg by mouth as needed for anxiety.       isosorbide mononitrate (IMDUR) 30 MG 24 hr tablet Take 30 mg by mouth daily.       lisinopril (ZESTRIL) 20 MG tablet Take 40 mg by mouth daily.       metoprolol succinate (TOPROL XL) 25 MG 24 hr tablet Take 1 tablet (25 mg total) by mouth daily. 90 tablet 3   nitroGLYCERIN (NITROSTAT) 0.4 MG SL tablet 1 tablet Sublingual 4 times a day as needed for 30 days       sertraline (ZOLOFT) 50 MG tablet Take 50 mg by mouth daily.       spironolactone (ALDACTONE) 50 MG tablet Take 50 mg by mouth daily.        No current facility-administered medications for this visit.            Past Medical History:  Diagnosis Date   CAD (coronary artery disease)     Hypertension        ROS:    All systems reviewed and negative except as noted in the HPI.          Past Surgical History:  Procedure Laterality Date   arm fracture        as a child. left lower arm   CORONARY ARTERY BYPASS GRAFT                 Family History  Problem Relation Age of  Onset   Hypertension Mother     Cancer Mother     CAD Mother     Hypertension Father     Coronary artery disease Father          Social History         Socioeconomic History   Marital status: Legally Separated      Spouse name: Not on file   Number of children: Not on file   Years of education: Not on file   Highest education level: Not on file  Occupational History   Not on file  Tobacco Use   Smoking status: Every Day      Packs/day: 1.00      Types: Cigarettes   Smokeless tobacco: Never  Vaping Use   Vaping Use: Never used  Substance and Sexual Activity   Alcohol use: Not Currently      Comment:  occasional beer,mixed drink   Drug use: Never      Types: Cocaine      Comment: last used over 1 week ago   Sexual activity: Not on file  Other Topics Concern   Not on file  Social History Narrative   Not on file    Social Determinants of Health    Financial Resource Strain: Not on file  Food Insecurity: Not on file  Transportation Needs: Not on file  Physical Activity: Not on file  Stress: Not on file  Social Connections: Not on file  Intimate Partner Violence: Not on file        BP 122/64   Pulse 78   Ht '5\' 11"'$  (1.803 m)   Wt 194 lb (88 kg)   SpO2 97%   BMI 27.06 kg/m    Physical Exam:   Well appearing NAD HEENT: Unremarkable Neck:  No JVD, no thyromegally Lymphatics:  No adenopathy Back:  No CVA tenderness Lungs:  Clear with no wheezes HEART:  Regular rate rhythm, no murmurs, no rubs, no clicks Abd:  soft, positive bowel sounds, no organomegally, no rebound, no guarding Ext:  2 plus pulses, no edema, no cyanosis, no clubbing Skin:  No rashes no nodules Neuro:  CN II through XII intact, motor grossly intact   Assess/Plan: ICM - I have discussed the treatment options with the patient and I have discussed the risks/ benefits/goals/expectations of ICD insertion and he will call us if he wishes to proceed. For now he will continue his medical  therapy. 2. HTN - his bp is well controlled.    Carleene Overlie Ozella Comins,MD

## 2022-10-28 ENCOUNTER — Encounter (HOSPITAL_COMMUNITY): Payer: Self-pay | Admitting: Internal Medicine

## 2022-10-30 ENCOUNTER — Telehealth: Payer: Self-pay

## 2022-10-30 NOTE — Telephone Encounter (Addendum)
Attempted to contact Pt.  Call went to VM.  Unable to leave a message.   Follow-up after same day discharge: Implant date: 10/27/2022 MD: Cristopher Peru, MD Device: BIO ICD   Wound check visit: 11/11/2022 at 2:40 pm 90 day MD follow-up: scheduled  Remote Transmission received: connected in [employer]  Dressing/sling removed:

## 2022-10-31 NOTE — Telephone Encounter (Signed)
Spoke with patient, voiced understanding of follow up appointment/ lifting restrictions as well as wound care

## 2022-11-11 ENCOUNTER — Telehealth: Payer: Self-pay

## 2022-11-11 ENCOUNTER — Ambulatory Visit: Payer: Medicare Other | Attending: Cardiology

## 2022-11-11 DIAGNOSIS — I255 Ischemic cardiomyopathy: Secondary | ICD-10-CM

## 2022-11-11 LAB — CUP PACEART INCLINIC DEVICE CHECK
Brady Statistic RV Percent Paced: 0 %
Date Time Interrogation Session: 20240319203208
HighPow Impedance: 79 Ohm
Implantable Lead Connection Status: 753985
Implantable Lead Implant Date: 20240304
Implantable Lead Location: 753860
Implantable Lead Model: 436909
Implantable Lead Serial Number: 81560680
Implantable Pulse Generator Implant Date: 20240304
Lead Channel Impedance Value: 540 Ohm
Lead Channel Pacing Threshold Amplitude: 0.5 V
Lead Channel Pacing Threshold Amplitude: 0.5 V
Lead Channel Pacing Threshold Amplitude: 0.6 V
Lead Channel Pacing Threshold Pulse Width: 0.4 ms
Lead Channel Pacing Threshold Pulse Width: 0.5 ms
Lead Channel Pacing Threshold Pulse Width: 0.5 ms
Lead Channel Sensing Intrinsic Amplitude: 15.7 mV
Lead Channel Sensing Intrinsic Amplitude: 23.1 mV
Pulse Gen Model: 429525
Pulse Gen Serial Number: 84953774

## 2022-11-11 NOTE — Patient Instructions (Addendum)
   After Your ICD (Implantable Cardiac Defibrillator)    Monitor your defibrillator site for redness, swelling, and drainage. Call the device clinic at [phone] if you experience these symptoms or fever/chills.  Your incision was closed with Steri-strips or staples:  You may shower 7 days after your procedure and wash your incision with soap and water. Avoid lotions, ointments, or perfumes over your incision until it is well-healed.  You may use a hot tub or a pool after your wound check appointment if the incision is completely closed.  Do not lift, push or pull greater than 10 pounds with the affected arm until 6 weeks after your procedure. Until AFTER APRIL 15TH.  There are no other restrictions in arm movement after your wound check appointment.   Your ICD is designed to protect you from life threatening heart rhythms. Because of this, you may receive a shock.   1 shock with no symptoms:  Call the office during business hours. 1 shock with symptoms (chest pain, chest pressure, dizziness, lightheadedness, shortness of breath, overall feeling unwell):  Call 911. If you experience 2 or more shocks in 24 hours:  Call 911. If you receive a shock, you should not drive.  [place] DMV - no driving for 6 months if you receive appropriate therapy from your ICD.   ICD Alerts:  Some alerts are vibratory and others beep. These are NOT emergencies. Please call our office to let us know. If this occurs at night or on weekends, it can wait until the next business day. Send a remote transmission.  If your device is capable of reading fluid status (for heart failure), you will be offered monthly monitoring to review this with you.   Remote monitoring is used to monitor your ICD from home. This monitoring is scheduled every 91 days by our office. It allows [place] to keep an eye on the functioning of your device to ensure it is working properly. You will routinely see your Electrophysiologist annually (more  often if necessary).

## 2022-11-11 NOTE — Progress Notes (Signed)
Wound check appointment. Steri-strips removed. Wound without redness or edema. Incision edges approximated, wound well healed. Normal device function. Thresholds, sensing, and impedances consistent with implant measurements. Device programmed at 3.0V for extra safety margin until 3 month visit. Histogram distribution appropriate for patient and level of activity. No mode switches or ventricular arrhythmias noted.  Only 1 episode noted which appears NSR at 75bpm with occasional PVC'S. No recorded VT or VF and no therapies noted. Patient educated about wound care, arm mobility, lifting restrictions, shock plan. ROV in 3 months with implanting physician.

## 2022-11-11 NOTE — Telephone Encounter (Signed)
error 

## 2022-11-24 ENCOUNTER — Telehealth: Payer: Self-pay | Admitting: Internal Medicine

## 2022-11-24 NOTE — Telephone Encounter (Signed)
  1. Has your device fired? no  2. Is you device beeping? no  3. Are you experiencing draining or swelling at device site?   4. Are you calling to see if we received your device transmission?   5. Have you passed out?  No, patient have some questions about his device    Please route to [place]

## 2022-11-24 NOTE — Telephone Encounter (Signed)
Patient was told by friends/family he needed a magnet to put on his ICD. Advised patient he does not need a magnet. Advised not to have a magnet near his device. Patient voiced understanding appreciative of call.

## 2022-12-31 ENCOUNTER — Encounter: Payer: Medicare Other | Admitting: Internal Medicine

## 2023-01-27 ENCOUNTER — Ambulatory Visit (INDEPENDENT_AMBULATORY_CARE_PROVIDER_SITE_OTHER): Payer: Medicare Other

## 2023-01-27 DIAGNOSIS — I255 Ischemic cardiomyopathy: Secondary | ICD-10-CM

## 2023-01-28 LAB — CUP PACEART REMOTE DEVICE CHECK
Date Time Interrogation Session: 20240605132759
Implantable Lead Connection Status: 753985
Implantable Lead Implant Date: 20240304
Implantable Lead Location: 753860
Implantable Lead Model: 436909
Implantable Lead Serial Number: 81560680
Implantable Pulse Generator Implant Date: 20240304
Pulse Gen Model: 429525
Pulse Gen Serial Number: 84953774

## 2023-02-05 ENCOUNTER — Encounter: Payer: Self-pay | Admitting: Internal Medicine

## 2023-02-05 ENCOUNTER — Ambulatory Visit: Payer: Medicare Other | Attending: Internal Medicine | Admitting: Internal Medicine

## 2023-02-05 VITALS — BP 136/78 | HR 75 | Ht 71.0 in | Wt 196.2 lb

## 2023-02-05 DIAGNOSIS — I5022 Chronic systolic (congestive) heart failure: Secondary | ICD-10-CM | POA: Diagnosis not present

## 2023-02-05 LAB — CUP PACEART INCLINIC DEVICE CHECK
Date Time Interrogation Session: 20240613125232
Implantable Lead Connection Status: 753985
Implantable Lead Implant Date: 20240304
Implantable Lead Location: 753860
Implantable Lead Model: 436909
Implantable Lead Serial Number: 81560680
Implantable Pulse Generator Implant Date: 20240304
Pulse Gen Model: 429525
Pulse Gen Serial Number: 84953774

## 2023-02-05 NOTE — Progress Notes (Signed)
HPI [name] returns today for followup. He is a pleasant 67 yo man with a h/o chronic systolic heart failure, CAD, tobacco abuse who underwent ICD insertion about 3 months ago. He has done well in the interim with no chest pain or sob. No ICD therapies. Allergies  Allergen Reactions   Farxiga [Dapagliflozin] Anaphylaxis    Throat swelling per pt     Current Outpatient Medications  Medication Sig Dispense Refill   albuterol (VENTOLIN HFA) 108 (90 Base) MCG/ACT inhaler as needed.     atorvastatin (LIPITOR) 20 MG tablet Take 20 mg by mouth at bedtime.     doxycycline (VIBRAMYCIN) 100 MG capsule Take 1 capsule (100 mg total) by mouth 2 (two) times daily. 14 capsule 0   HYDROcodone-acetaminophen (NORCO) 5-325 MG tablet Take 1 tablet by mouth every 4 (four) hours as needed. 20 tablet 0   hydrOXYzine (ATARAX) 10 MG tablet Take 10 mg by mouth as needed for anxiety.     isosorbide mononitrate (IMDUR) 30 MG 24 hr tablet Take 30 mg by mouth daily.     lisinopril (ZESTRIL) 20 MG tablet Take 40 mg by mouth daily.     metoprolol succinate (TOPROL XL) 25 MG 24 hr tablet Take 1 tablet (25 mg total) by mouth daily. 90 tablet 3   nitroGLYCERIN (NITROSTAT) 0.4 MG SL tablet 1 tablet Sublingual 4 times a day as needed for 30 days 45 tablet 3   sertraline (ZOLOFT) 50 MG tablet Take 50 mg by mouth daily.     spironolactone (ALDACTONE) 50 MG tablet Take 50 mg by mouth daily.     traZODone (DESYREL) 50 MG tablet Take 50 mg by mouth at bedtime.     No current facility-administered medications for this visit.     Past Medical History:  Diagnosis Date   CAD (coronary artery disease)    Hypertension     ROS:   All systems reviewed and negative except as noted in the HPI.   Past Surgical History:  Procedure Laterality Date   arm fracture     as a child. left lower arm   CORONARY ARTERY BYPASS GRAFT     ICD IMPLANT N/A 10/27/2022   Procedure: ICD IMPLANT;  Surgeon: Marinus Maw, MD;   Location: [facility] INVASIVE CV LAB;  Service: Cardiovascular;  Laterality: N/A;     Family History  Problem Relation Age of Onset   Hypertension Mother    Cancer Mother    CAD Mother    Hypertension Father    Coronary artery disease Father      Social History   Socioeconomic History   Marital status: Legally Separated    Spouse name: Not on file   Number of children: Not on file   Years of education: Not on file   Highest education level: Not on file  Occupational History   Not on file  Tobacco Use   Smoking status: Every Day    Packs/day: 1    Types: Cigarettes   Smokeless tobacco: Never  Vaping Use   Vaping Use: Never used  Substance and Sexual Activity   Alcohol use: Not Currently    Comment: occasional beer,mixed drink   Drug use: Never    Types: Cocaine    Comment: last used over 1 week ago   Sexual activity: Not on file  Other Topics Concern   Not on file  Social History Narrative   Not on file   Social Determinants of  Health   Financial Resource Strain: Not on file  Food Insecurity: Not on file  Transportation Needs: Not on file  Physical Activity: Not on file  Stress: Not on file  Social Connections: Not on file  Intimate Partner Violence: Not on file     BP 136/78   Pulse 75   Ht 5\' 11"  (1.803 m)   Wt 196 lb 3.2 oz (89 kg)   SpO2 92%   BMI 27.36 kg/m   Physical Exam:  Well appearing NAD HEENT: Unremarkable Neck:  No JVD, no thyromegally Lymphatics:  No adenopathy Back:  No CVA tenderness Lungs:  Clear with no wheezes HEART:  Regular rate rhythm, no murmurs, no rubs, no clicks Abd:  soft, positive bowel sounds, no organomegally, no rebound, no guarding Ext:  2 plus pulses, no edema, no cyanosis, no clubbing Skin:  No rashes no nodules Neuro:  CN II through XII intact, motor grossly intact  DEVICE  Normal device function.  See PaceArt for details.   Assess/Plan:  Chronic systolic heart failure - His symptoms are class 2. He will  continue GDMT.  ICD - he is s/p Biotronik VDD ICD insertion and his device is working normally. I will see him back in a year.    Sharlot Gowda Valinda Fedie,MD

## 2023-02-05 NOTE — Patient Instructions (Signed)
Medication Instructions:  Your physician recommends that you continue on your current medications as directed. Please refer to the Current Medication list given to you today.   Labwork: None today  Testing/Procedures: None today  Follow-Up: 1 year Dr.Taylor  Any Other Special Instructions Will Be Listed Below (If Applicable).  If you need a refill on your cardiac medications before your next appointment, please call your pharmacy.  

## 2023-02-18 NOTE — Progress Notes (Signed)
Remote ICD transmission.   

## 2023-02-23 ENCOUNTER — Telehealth: Payer: Self-pay

## 2023-02-23 NOTE — Telephone Encounter (Signed)
[employer] alert received for AF w/ continued with duration 1 minutes 19 seconds. No OAC noted on MAR. Routing to Dr. Ladona Ridgel for recommendations. Did not send to AF clinic d/t such short duration.

## 2023-02-24 NOTE — Telephone Encounter (Signed)
Agree. Watchful waiting for now.

## 2023-04-28 ENCOUNTER — Ambulatory Visit (INDEPENDENT_AMBULATORY_CARE_PROVIDER_SITE_OTHER): Payer: Medicare Other

## 2023-04-28 DIAGNOSIS — I255 Ischemic cardiomyopathy: Secondary | ICD-10-CM

## 2023-04-28 DIAGNOSIS — I5020 Unspecified systolic (congestive) heart failure: Secondary | ICD-10-CM

## 2023-04-28 LAB — CUP PACEART REMOTE DEVICE CHECK
Date Time Interrogation Session: 20240903161644
Implantable Lead Connection Status: 753985
Implantable Lead Implant Date: 20240304
Implantable Lead Location: 753860
Implantable Lead Model: 436909
Implantable Lead Serial Number: 81560680
Implantable Pulse Generator Implant Date: 20240304
Pulse Gen Model: 429525
Pulse Gen Serial Number: 84953774

## 2023-05-05 NOTE — Progress Notes (Signed)
Remote ICD transmission.   

## 2023-07-28 ENCOUNTER — Ambulatory Visit (INDEPENDENT_AMBULATORY_CARE_PROVIDER_SITE_OTHER): Payer: Medicare Other

## 2023-07-28 DIAGNOSIS — I255 Ischemic cardiomyopathy: Secondary | ICD-10-CM

## 2023-07-28 DIAGNOSIS — I5020 Unspecified systolic (congestive) heart failure: Secondary | ICD-10-CM

## 2023-07-29 LAB — CUP PACEART REMOTE DEVICE CHECK
Date Time Interrogation Session: 20241204143441
Implantable Lead Connection Status: 753985
Implantable Lead Implant Date: 20240304
Implantable Lead Location: 753860
Implantable Lead Model: 436909
Implantable Lead Serial Number: 81560680
Implantable Pulse Generator Implant Date: 20240304
Pulse Gen Model: 429525
Pulse Gen Serial Number: 84953774

## 2023-10-27 ENCOUNTER — Ambulatory Visit (INDEPENDENT_AMBULATORY_CARE_PROVIDER_SITE_OTHER): Payer: Medicare Other

## 2023-10-27 DIAGNOSIS — I5020 Unspecified systolic (congestive) heart failure: Secondary | ICD-10-CM

## 2023-10-27 DIAGNOSIS — I255 Ischemic cardiomyopathy: Secondary | ICD-10-CM | POA: Diagnosis not present

## 2023-10-29 LAB — CUP PACEART REMOTE DEVICE CHECK
Date Time Interrogation Session: 20250306132027
Implantable Lead Connection Status: 753985
Implantable Lead Implant Date: 20240304
Implantable Lead Location: 753860
Implantable Lead Model: 436909
Implantable Lead Serial Number: 81560680
Implantable Pulse Generator Implant Date: 20240304
Pulse Gen Model: 429525
Pulse Gen Serial Number: 84953774

## 2023-11-09 ENCOUNTER — Other Ambulatory Visit (HOSPITAL_COMMUNITY): Payer: Self-pay | Admitting: Gerontology

## 2023-11-09 DIAGNOSIS — F17200 Nicotine dependence, unspecified, uncomplicated: Secondary | ICD-10-CM

## 2023-11-30 ENCOUNTER — Ambulatory Visit (HOSPITAL_COMMUNITY): Admission: RE | Admit: 2023-11-30 | Source: Ambulatory Visit

## 2023-11-30 ENCOUNTER — Encounter (HOSPITAL_COMMUNITY): Payer: Self-pay

## 2023-11-30 NOTE — Progress Notes (Signed)
 Remote ICD transmission.

## 2023-11-30 NOTE — Addendum Note (Signed)
 Addended by: Geralyn Flash D on: 11/30/2023 03:43 PM   Modules accepted: Orders

## 2023-12-07 ENCOUNTER — Telehealth: Payer: Self-pay

## 2023-12-07 NOTE — Telephone Encounter (Signed)
 Call back received from Pt.  He was unaware of episodes.  Denies any health changes.  States he is taking metoprolol succinate as ordered.  Advised would forward to Dr. Carolynne Citron for review and call back if any changes indicated.  Also sent message to scheduler-Pt due for follow up in June 2025.

## 2023-12-07 NOTE — Telephone Encounter (Addendum)
 Alert received from [employer] for 2 NSVT episodes:  Outreach made to Pt.  Left message requesting call back.

## 2023-12-10 DIAGNOSIS — I10 Essential (primary) hypertension: Secondary | ICD-10-CM | POA: Diagnosis not present

## 2023-12-10 DIAGNOSIS — E785 Hyperlipidemia, unspecified: Secondary | ICD-10-CM | POA: Diagnosis not present

## 2023-12-10 DIAGNOSIS — Z969 Presence of functional implant, unspecified: Secondary | ICD-10-CM | POA: Diagnosis not present

## 2023-12-10 NOTE — Telephone Encounter (Signed)
no change in treatment

## 2024-01-03 ENCOUNTER — Ambulatory Visit (HOSPITAL_COMMUNITY)
Admission: RE | Admit: 2024-01-03 | Discharge: 2024-01-03 | Disposition: A | Discharge status: Home / Self Care | Source: Ambulatory Visit | Attending: Gerontology | Admitting: Gerontology

## 2024-01-03 DIAGNOSIS — K8020 Calculus of gallbladder without cholecystitis without obstruction: Secondary | ICD-10-CM | POA: Insufficient documentation

## 2024-01-03 DIAGNOSIS — Z122 Encounter for screening for malignant neoplasm of respiratory organs: Secondary | ICD-10-CM | POA: Insufficient documentation

## 2024-01-03 DIAGNOSIS — F17200 Nicotine dependence, unspecified, uncomplicated: Secondary | ICD-10-CM

## 2024-01-03 DIAGNOSIS — F17210 Nicotine dependence, cigarettes, uncomplicated: Secondary | ICD-10-CM | POA: Insufficient documentation

## 2024-01-03 DIAGNOSIS — J439 Emphysema, unspecified: Secondary | ICD-10-CM | POA: Diagnosis not present

## 2024-01-03 DIAGNOSIS — I700 Atherosclerosis of aorta: Secondary | ICD-10-CM | POA: Diagnosis not present

## 2024-01-03 DIAGNOSIS — M4854XS Collapsed vertebra, not elsewhere classified, thoracic region, sequela of fracture: Secondary | ICD-10-CM | POA: Diagnosis not present

## 2024-01-09 DIAGNOSIS — Z969 Presence of functional implant, unspecified: Secondary | ICD-10-CM | POA: Diagnosis not present

## 2024-01-09 DIAGNOSIS — E785 Hyperlipidemia, unspecified: Secondary | ICD-10-CM | POA: Diagnosis not present

## 2024-01-09 DIAGNOSIS — I10 Essential (primary) hypertension: Secondary | ICD-10-CM | POA: Diagnosis not present

## 2024-01-26 ENCOUNTER — Ambulatory Visit (INDEPENDENT_AMBULATORY_CARE_PROVIDER_SITE_OTHER): Payer: Medicare Other

## 2024-01-26 DIAGNOSIS — I255 Ischemic cardiomyopathy: Secondary | ICD-10-CM | POA: Diagnosis not present

## 2024-01-26 LAB — CUP PACEART REMOTE DEVICE CHECK
Date Time Interrogation Session: 20250603081124
Implantable Lead Connection Status: 753985
Implantable Lead Implant Date: 20240304
Implantable Lead Location: 753860
Implantable Lead Model: 436909
Implantable Lead Serial Number: 81560680
Implantable Pulse Generator Implant Date: 20240304
Pulse Gen Model: 429525
Pulse Gen Serial Number: 84953774

## 2024-01-28 ENCOUNTER — Ambulatory Visit: Payer: Self-pay | Admitting: Internal Medicine

## 2024-02-09 DIAGNOSIS — Z969 Presence of functional implant, unspecified: Secondary | ICD-10-CM | POA: Diagnosis not present

## 2024-02-09 DIAGNOSIS — E785 Hyperlipidemia, unspecified: Secondary | ICD-10-CM | POA: Diagnosis not present

## 2024-02-09 DIAGNOSIS — I10 Essential (primary) hypertension: Secondary | ICD-10-CM | POA: Diagnosis not present

## 2024-03-09 ENCOUNTER — Ambulatory Visit (INDEPENDENT_AMBULATORY_CARE_PROVIDER_SITE_OTHER): Admitting: Surgical

## 2024-03-09 ENCOUNTER — Encounter: Payer: Self-pay | Admitting: Surgical

## 2024-03-09 DIAGNOSIS — S22040A Wedge compression fracture of fourth thoracic vertebra, initial encounter for closed fracture: Secondary | ICD-10-CM

## 2024-03-09 NOTE — Progress Notes (Signed)
 Office Visit Note   Patient: Aaron Hines           Date of Birth: 04/07/1956           MRN: [id number] Visit Date: 03/09/2024 Requested by: Carlette Benita Area, MD [address] [place],  [place] [address] PCP: Carlette Benita Area, MD  Subjective: Chief Complaint  Patient presents with   Middle Back - Fracture    T4 compression fracture showed up on screening CT for lung cancer, patient does not endorse any back pain at all and is not sure why he was referred here other than the incidental finding on the xray     HPI: Aaron Hines is a 68 y.o. male who presents to the office reporting no complaint.  He is here today on referral for chronic T4 compression fracture that was found incidentally on CT chest that was done in May 2025.  He denies any upper back pain.  Does occasionally have some low back pain but nothing that really bothers him significantly.  Has CT scans to screen for lung cancer as he has history of smoking.  Denies any history of osteoporosis, cancer.  No recent or remote axial injury..                ROS: All systems reviewed are negative as they relate to the chief complaint within the history of present illness.  Patient denies fevers or chills.  Assessment & Plan: Visit Diagnoses: No diagnosis found.  Plan: Patient is a 68 year old male who is here for evaluation of incidental finding on CT scan demonstrating chronic T4 vertebral compression fracture.  Had same finding on prior CT scan that was done in January 2024 with no change in appearance when compared with CT chest from 2025.  With lack of symptoms and the chronicity of this injury, no need for further intervention at this time.  Follow-up with the office as needed.  Follow-Up Instructions: No follow-ups on file.   Orders:  No orders of the defined types were placed in this encounter.  No orders of the defined types were placed in this encounter.     Procedures: No procedures  performed   Clinical Data: No additional findings.  Objective: Vital Signs: There were no vitals taken for this visit.  Physical Exam:  Constitutional: Patient appears well-developed HEENT:  Head: Normocephalic Eyes:EOM are normal Neck: Normal range of motion Cardiovascular: Normal rate Pulmonary/chest: Effort normal Neurologic: Patient is alert Skin: Skin is warm Psychiatric: Patient has normal mood and affect  Ortho Exam: Ortho exam demonstrates no tenderness throughout the upper thoracic spine.  No pain with percussion.  No reproducible radicular pain in the chest wall.  No cellulitis or skin changes noted.  No masses noted.  Specialty Comments:  No specialty comments available.  Imaging: No results found.   PMFS History: Patient Active Problem List   Diagnosis Date Noted   HFrEF (heart failure with reduced ejection fraction) (HCC) 12/10/2021   Essential hypertension 12/10/2021   Tobacco abuse 12/10/2021   Coronary artery disease of native artery of native heart with stable angina pectoris (HCC) 12/10/2021   Chest pain, unspecified 02/18/2021   Chronic pain 12/13/2019   Low back pain 12/13/2019   Simple chronic bronchitis (HCC) 12/13/2019   Acute ST elevation myocardial infarction (STEMI) (HCC) 07/06/2014   Past Medical History:  Diagnosis Date   CAD (coronary artery disease)    Hypertension     Family History  Problem Relation  Age of Onset   Hypertension Mother    Cancer Mother    CAD Mother    Hypertension Father    Coronary artery disease Father     Past Surgical History:  Procedure Laterality Date   arm fracture     as a child. left lower arm   CORONARY ARTERY BYPASS GRAFT     ICD IMPLANT N/A 10/27/2022   Procedure: ICD IMPLANT;  Surgeon: Waddell Danelle ORN, MD;  Location: [facility] INVASIVE CV LAB;  Service: Cardiovascular;  Laterality: N/A;   Social History   Occupational History   Not on file  Tobacco Use   Smoking status: Every Day    Current  packs/day: 1.00    Types: Cigarettes   Smokeless tobacco: Never  Vaping Use   Vaping status: Never Used  Substance and Sexual Activity   Alcohol use: Not Currently    Comment: occasional beer,mixed drink   Drug use: Never    Types: Cocaine    Comment: last used over 1 week ago   Sexual activity: Not on file

## 2024-03-10 DIAGNOSIS — I10 Essential (primary) hypertension: Secondary | ICD-10-CM | POA: Diagnosis not present

## 2024-03-10 DIAGNOSIS — E785 Hyperlipidemia, unspecified: Secondary | ICD-10-CM | POA: Diagnosis not present

## 2024-03-10 DIAGNOSIS — Z969 Presence of functional implant, unspecified: Secondary | ICD-10-CM | POA: Diagnosis not present

## 2024-03-22 NOTE — Progress Notes (Signed)
 Remote ICD transmission.

## 2024-04-10 DIAGNOSIS — Z969 Presence of functional implant, unspecified: Secondary | ICD-10-CM | POA: Diagnosis not present

## 2024-04-10 DIAGNOSIS — E785 Hyperlipidemia, unspecified: Secondary | ICD-10-CM | POA: Diagnosis not present

## 2024-04-10 DIAGNOSIS — I10 Essential (primary) hypertension: Secondary | ICD-10-CM | POA: Diagnosis not present

## 2024-04-13 ENCOUNTER — Ambulatory Visit (INDEPENDENT_AMBULATORY_CARE_PROVIDER_SITE_OTHER): Admitting: Surgical

## 2024-04-13 ENCOUNTER — Encounter: Payer: Self-pay | Admitting: Surgical

## 2024-04-13 ENCOUNTER — Other Ambulatory Visit: Payer: Self-pay

## 2024-04-13 DIAGNOSIS — M549 Dorsalgia, unspecified: Secondary | ICD-10-CM | POA: Diagnosis not present

## 2024-04-13 NOTE — Progress Notes (Signed)
 Office Visit Note   Patient: Aaron Hines           Date of Birth: 27-Feb-1956           MRN: [id number] Visit Date: 04/13/2024 Requested by: Carlette Benita Area, MD [address] [place],  [place] [address] PCP: Carlette Benita Area, MD  Subjective: Chief Complaint  Patient presents with   mid back pain    HPI: Aaron Hines is a 68 y.o. male who presents to the office reporting back pain.  Patient localizes pain to the bilateral scapular regions and the upper back.  He states he has been noticing more pain in the last month since his last appointment.  Previously was asymptomatic.  We discussed the concept of kyphoplasty at the last visit and he states he is interested in pursuing this.  He has talked with several friends who is had this done and they advised him to proceed.  Not taking any medications for pain.  He denies any radicular arm pain.  No axial thoracic spine pain.  No neck pain.  No headaches..                ROS: All systems reviewed are negative as they relate to the chief complaint within the history of present illness.  Patient denies fevers or chills.  Assessment & Plan: Visit Diagnoses:  1. Mid back pain     Plan: Impression is 68 year old male who has longstanding history of T4 compression fracture that was noted incidentally.  Previously was having no pain.  Now he reports he is having some scapular region pain in both sides but a little worse than the left.  No axial spine pain.  With lack of significant weakness, radicular symptoms or axial spine pain, recommended against kyphoplasty especially in the setting of this chronic injury.  With more of his pain appearing muscular in origin, recommended working with physical therapy and we will set this up for him.  Follow-up in 6 weeks if no improvement.  AP and lateral views of thoracic spine reviewed today.  No acute fracture or spondylolisthesis.  T4 compression fracture again noted with stable height  when measured compared with the previous CT scan.  Follow-Up Instructions: Return in about 6 weeks (around 05/25/2024).   Orders:  Orders Placed This Encounter  Procedures   DG Thoracic Spine 2 View   Ambulatory referral to Physical Therapy   No orders of the defined types were placed in this encounter.     Procedures: No procedures performed   Clinical Data: No additional findings.  Objective: Vital Signs: There were no vitals taken for this visit.  Physical Exam:  Constitutional: Patient appears well-developed HEENT:  Head: Normocephalic Eyes:EOM are normal Neck: Normal range of motion Cardiovascular: Normal rate Pulmonary/chest: Effort normal Neurologic: Patient is alert Skin: Skin is warm Psychiatric: Patient has normal mood and affect  Ortho Exam: Ortho exam demonstrates intact EPL, FPL, finger abduction, pronation/supination, bicep, tricep, deltoid.  Excellent rotator cuff strength of the right shoulder.  Intact supra, subscap strength to the left shoulder with some 4/5 weakness of infraspinatus of the left shoulder.  No crepitus noted with passive motion of the shoulder.  No pain localizing to the shoulder with active or passive range of motion.  2+ radial pulse of bilateral upper extremities.  Negative Spurling sign.  Negative Lhermitte sign.  Has mildly reduced cervical spine range of motion.  Really no tenderness throughout the axial cervical or lumbar or  thoracic spine but does have some paraspinal tenderness primarily in the left scapular region.  Specialty Comments:  No specialty comments available.  Imaging: No results found.   PMFS History: Patient Active Problem List   Diagnosis Date Noted   HFrEF (heart failure with reduced ejection fraction) (HCC) 12/10/2021   Essential hypertension 12/10/2021   Tobacco abuse 12/10/2021   Coronary artery disease of native artery of native heart with stable angina pectoris (HCC) 12/10/2021   Chest pain, unspecified  02/18/2021   Chronic pain 12/13/2019   Low back pain 12/13/2019   Simple chronic bronchitis (HCC) 12/13/2019   Acute ST elevation myocardial infarction (STEMI) (HCC) 07/06/2014   Past Medical History:  Diagnosis Date   CAD (coronary artery disease)    Hypertension     Family History  Problem Relation Age of Onset   Hypertension Mother    Cancer Mother    CAD Mother    Hypertension Father    Coronary artery disease Father     Past Surgical History:  Procedure Laterality Date   arm fracture     as a child. left lower arm   CORONARY ARTERY BYPASS GRAFT     ICD IMPLANT N/A 10/27/2022   Procedure: ICD IMPLANT;  Surgeon: Waddell Danelle ORN, MD;  Location: [facility] INVASIVE CV LAB;  Service: Cardiovascular;  Laterality: N/A;   Social History   Occupational History   Not on file  Tobacco Use   Smoking status: Every Day    Current packs/day: 1.00    Types: Cigarettes   Smokeless tobacco: Never  Vaping Use   Vaping status: Never Used  Substance and Sexual Activity   Alcohol use: Not Currently    Comment: occasional beer,mixed drink   Drug use: Never    Types: Cocaine    Comment: last used over 1 week ago   Sexual activity: Not on file

## 2024-04-18 ENCOUNTER — Encounter: Payer: Self-pay | Admitting: Internal Medicine

## 2024-04-18 ENCOUNTER — Ambulatory Visit: Attending: Internal Medicine | Admitting: Internal Medicine

## 2024-04-18 VITALS — BP 110/70 | HR 96 | Ht 72.0 in | Wt 190.0 lb

## 2024-04-18 DIAGNOSIS — I5020 Unspecified systolic (congestive) heart failure: Secondary | ICD-10-CM

## 2024-04-18 NOTE — Progress Notes (Signed)
 HPI [name] returns today for followup. He is a pleasant 68 yo man with a h/o chronic systolic heart failure, CAD, tobacco abuse who underwent ICD insertion about 18 months ago. He has done well in the interim with no chest pain or sob. No ICD therapies.  Allergies  Allergen Reactions   Farxiga  [Dapagliflozin ] Anaphylaxis    Throat swelling per pt     Current Outpatient Medications  Medication Sig Dispense Refill   atorvastatin (LIPITOR) 20 MG tablet Take 20 mg by mouth at bedtime.     HYDROcodone -acetaminophen  (NORCO) 5-325 MG tablet Take 1 tablet by mouth every 4 (four) hours as needed. 20 tablet 0   isosorbide mononitrate (IMDUR) 30 MG 24 hr tablet Take 30 mg by mouth daily.     lisinopril (ZESTRIL) 20 MG tablet Take 40 mg by mouth daily.     metoprolol  succinate (TOPROL  XL) 25 MG 24 hr tablet Take 1 tablet (25 mg total) by mouth daily. 90 tablet 3   nitroGLYCERIN  (NITROSTAT ) 0.4 MG SL tablet 1 tablet Sublingual 4 times a day as needed for 30 days 45 tablet 3   sertraline (ZOLOFT) 50 MG tablet Take 50 mg by mouth daily.     spironolactone (ALDACTONE) 50 MG tablet Take 50 mg by mouth daily.     albuterol (VENTOLIN HFA) 108 (90 Base) MCG/ACT inhaler as needed.     hydrOXYzine (ATARAX) 10 MG tablet Take 10 mg by mouth as needed for anxiety.     traZODone (DESYREL) 50 MG tablet Take 50 mg by mouth at bedtime. (Patient not taking: Reported on 04/18/2024)     No current facility-administered medications for this visit.     Past Medical History:  Diagnosis Date   CAD (coronary artery disease)    Hypertension     ROS:   All systems reviewed and negative except as noted in the HPI.   Past Surgical History:  Procedure Laterality Date   arm fracture     as a child. left lower arm   CORONARY ARTERY BYPASS GRAFT     ICD IMPLANT N/A 10/27/2022   Procedure: ICD IMPLANT;  Surgeon: Waddell Danelle ORN, MD;  Location: [facility] INVASIVE CV LAB;  Service: Cardiovascular;  Laterality: N/A;      Family History  Problem Relation Age of Onset   Hypertension Mother    Cancer Mother    CAD Mother    Hypertension Father    Coronary artery disease Father      Social History   Socioeconomic History   Marital status: Legally Separated    Spouse name: Not on file   Number of children: Not on file   Years of education: Not on file   Highest education level: Not on file  Occupational History   Not on file  Tobacco Use   Smoking status: Every Day    Current packs/day: 1.00    Types: Cigarettes   Smokeless tobacco: Never  Vaping Use   Vaping status: Never Used  Substance and Sexual Activity   Alcohol use: Not Currently    Comment: occasional beer,mixed drink   Drug use: Never    Types: Cocaine    Comment: last used over 1 week ago   Sexual activity: Not on file  Other Topics Concern   Not on file  Social History Narrative   Not on file   Social Drivers of Health   Financial Resource Strain: Not on file  Food Insecurity: Not on  file  Transportation Needs: Not on file  Physical Activity: Not on file  Stress: Not on file  Social Connections: Not on file  Intimate Partner Violence: Not on file     BP 110/70 (BP Location: Right Arm, Cuff Size: Normal)   Pulse 96   Ht 6' (1.829 m)   Wt 190 lb (86.2 kg)   SpO2 96%   BMI 25.77 kg/m   Physical Exam:  Well appearing NAD HEENT: Unremarkable Neck:  No JVD, no thyromegally Lymphatics:  No adenopathy Back:  No CVA tenderness Lungs:  Clear with no wheezes HEART:  Regular rate rhythm, no murmurs, no rubs, no clicks Abd:  soft, positive bowel sounds, no organomegally, no rebound, no guarding Ext:  2 plus pulses, no edema, no cyanosis, no clubbing Skin:  No rashes no nodules Neuro:  CN II through XII intact, motor grossly intact  EKG - ST with PAC's and RBBB  DEVICE  Normal device function.  See PaceArt for details.   Assess/Plan:  Chronic systolic heart failure - His symptoms are class 2. He will  continue GDMT.  ICD - he is s/p [employer] VDD ICD insertion and his device is working normally. I will see him back in a year.    Danelle Clorissa Gruenberg,MD

## 2024-04-18 NOTE — Patient Instructions (Signed)

## 2024-04-19 LAB — CUP PACEART INCLINIC DEVICE CHECK
Battery Remaining Percentage: 100 %
Battery Voltage: 3.11 V
Brady Statistic AS VP Percent: 0 %
Brady Statistic AS VS Percent: 100 %
Brady Statistic RV Percent Paced: 0 %
Date Time Interrogation Session: 20250825131454
HighPow Impedance: 97 Ohm
Implantable Lead Connection Status: 753985
Implantable Lead Implant Date: 20240304
Implantable Lead Location: 753860
Implantable Lead Model: 436909
Implantable Lead Serial Number: 81560680
Implantable Pulse Generator Implant Date: 20240304
Lead Channel Impedance Value: 521 Ohm
Lead Channel Pacing Threshold Amplitude: 0.7 V
Lead Channel Pacing Threshold Pulse Width: 0.4 ms
Lead Channel Setting Pacing Amplitude: 2.5 V
Lead Channel Setting Pacing Pulse Width: 0.4 ms
Lead Channel Setting Sensing Sensitivity: 0.8 mV
Pulse Gen Model: 429525
Pulse Gen Serial Number: 84953774
Zone Setting Status: 755011

## 2024-04-22 ENCOUNTER — Encounter: Admitting: Internal Medicine

## 2024-04-26 ENCOUNTER — Ambulatory Visit (INDEPENDENT_AMBULATORY_CARE_PROVIDER_SITE_OTHER): Payer: Medicare Other

## 2024-04-26 DIAGNOSIS — I255 Ischemic cardiomyopathy: Secondary | ICD-10-CM

## 2024-04-28 LAB — CUP PACEART REMOTE DEVICE CHECK
Date Time Interrogation Session: 20250902144055
Implantable Lead Connection Status: 753985
Implantable Lead Implant Date: 20240304
Implantable Lead Location: 753860
Implantable Lead Model: 436909
Implantable Lead Serial Number: 81560680
Implantable Pulse Generator Implant Date: 20240304
Pulse Gen Model: 429525
Pulse Gen Serial Number: 84953774

## 2024-05-01 ENCOUNTER — Ambulatory Visit: Payer: Self-pay | Admitting: Internal Medicine

## 2024-05-03 NOTE — Progress Notes (Signed)
Remote ICD Transmission.

## 2024-06-10 DIAGNOSIS — Z969 Presence of functional implant, unspecified: Secondary | ICD-10-CM | POA: Diagnosis not present

## 2024-06-10 DIAGNOSIS — I10 Essential (primary) hypertension: Secondary | ICD-10-CM | POA: Diagnosis not present

## 2024-06-10 DIAGNOSIS — E785 Hyperlipidemia, unspecified: Secondary | ICD-10-CM | POA: Diagnosis not present

## 2024-07-26 ENCOUNTER — Ambulatory Visit: Payer: Medicare Other

## 2024-07-26 DIAGNOSIS — I255 Ischemic cardiomyopathy: Secondary | ICD-10-CM

## 2024-07-27 LAB — CUP PACEART REMOTE DEVICE CHECK
Date Time Interrogation Session: 20251202081314
Implantable Lead Connection Status: 753985
Implantable Lead Implant Date: 20240304
Implantable Lead Location: 753860
Implantable Lead Model: 436909
Implantable Lead Serial Number: 81560680
Implantable Pulse Generator Implant Date: 20240304
Pulse Gen Model: 429525
Pulse Gen Serial Number: 84953774

## 2024-07-29 ENCOUNTER — Ambulatory Visit: Payer: Self-pay | Admitting: Internal Medicine

## 2024-07-29 NOTE — Progress Notes (Signed)
 Remote ICD Transmission

## 2028-04-15 ENCOUNTER — Ambulatory Visit: Admitting: Surgical
